# Patient Record
Sex: Female | Born: 1965 | Race: White | Hispanic: No | Marital: Married | Smoking: Never smoker
Health system: Southern US, Community
[De-identification: ages and names within clinical notes are randomized; demographics above are authoritative.]

## PROBLEM LIST (undated history)

## (undated) DIAGNOSIS — R112 Nausea with vomiting, unspecified: Secondary | ICD-10-CM

## (undated) DIAGNOSIS — Z9889 Other specified postprocedural states: Secondary | ICD-10-CM

## (undated) DIAGNOSIS — Z8489 Family history of other specified conditions: Secondary | ICD-10-CM

## (undated) HISTORY — PX: TONSILLECTOMY: SUR1361

---

## 1998-05-22 ENCOUNTER — Other Ambulatory Visit: Admission: RE | Admit: 1998-05-22 | Discharge: 1998-05-22 | Payer: Self-pay | Admitting: Obstetrics and Gynecology

## 1998-09-09 ENCOUNTER — Ambulatory Visit (HOSPITAL_COMMUNITY): Admission: RE | Admit: 1998-09-09 | Discharge: 1998-09-09 | Payer: Self-pay | Admitting: Obstetrics and Gynecology

## 1998-09-18 ENCOUNTER — Ambulatory Visit (HOSPITAL_COMMUNITY): Admission: RE | Admit: 1998-09-18 | Discharge: 1998-09-18 | Payer: Self-pay | Admitting: Obstetrics and Gynecology

## 1998-09-29 ENCOUNTER — Encounter: Payer: Self-pay | Admitting: Obstetrics and Gynecology

## 1998-09-29 ENCOUNTER — Ambulatory Visit (HOSPITAL_COMMUNITY): Admission: RE | Admit: 1998-09-29 | Discharge: 1998-09-29 | Payer: Self-pay | Admitting: Obstetrics and Gynecology

## 1998-11-19 ENCOUNTER — Inpatient Hospital Stay (HOSPITAL_COMMUNITY): Admission: AD | Admit: 1998-11-19 | Discharge: 1998-11-22 | Payer: Self-pay | Admitting: Obstetrics and Gynecology

## 1998-11-25 ENCOUNTER — Encounter (HOSPITAL_COMMUNITY): Admission: RE | Admit: 1998-11-25 | Discharge: 1999-02-23 | Payer: Self-pay | Admitting: Obstetrics and Gynecology

## 1998-11-25 ENCOUNTER — Inpatient Hospital Stay (HOSPITAL_COMMUNITY): Admission: AD | Admit: 1998-11-25 | Discharge: 1998-11-25 | Payer: Self-pay | Admitting: Obstetrics and Gynecology

## 2000-09-20 ENCOUNTER — Other Ambulatory Visit: Admission: RE | Admit: 2000-09-20 | Discharge: 2000-09-20 | Payer: Self-pay | Admitting: Obstetrics and Gynecology

## 2000-11-12 ENCOUNTER — Emergency Department (HOSPITAL_COMMUNITY): Admission: EM | Admit: 2000-11-12 | Discharge: 2000-11-12 | Payer: Self-pay | Admitting: Emergency Medicine

## 2002-06-04 ENCOUNTER — Encounter: Payer: Self-pay | Admitting: Obstetrics and Gynecology

## 2002-06-04 ENCOUNTER — Ambulatory Visit (HOSPITAL_COMMUNITY): Admission: RE | Admit: 2002-06-04 | Discharge: 2002-06-04 | Payer: Self-pay | Admitting: Obstetrics and Gynecology

## 2005-01-21 ENCOUNTER — Ambulatory Visit: Payer: Self-pay | Admitting: Internal Medicine

## 2005-04-12 ENCOUNTER — Ambulatory Visit: Payer: Self-pay | Admitting: Internal Medicine

## 2006-01-11 ENCOUNTER — Ambulatory Visit: Payer: Self-pay | Admitting: Internal Medicine

## 2006-08-11 ENCOUNTER — Encounter: Admission: RE | Admit: 2006-08-11 | Discharge: 2006-08-11 | Payer: Self-pay | Admitting: Obstetrics and Gynecology

## 2007-10-09 ENCOUNTER — Encounter: Admission: RE | Admit: 2007-10-09 | Discharge: 2007-10-09 | Payer: Self-pay | Admitting: Obstetrics and Gynecology

## 2007-10-16 ENCOUNTER — Encounter: Admission: RE | Admit: 2007-10-16 | Discharge: 2007-10-16 | Payer: Self-pay | Admitting: Obstetrics and Gynecology

## 2008-07-26 ENCOUNTER — Ambulatory Visit: Payer: Self-pay | Admitting: Internal Medicine

## 2008-07-26 DIAGNOSIS — K5909 Other constipation: Secondary | ICD-10-CM

## 2008-07-26 DIAGNOSIS — G5600 Carpal tunnel syndrome, unspecified upper limb: Secondary | ICD-10-CM

## 2008-11-11 ENCOUNTER — Encounter: Admission: RE | Admit: 2008-11-11 | Discharge: 2008-11-11 | Payer: Self-pay | Admitting: Obstetrics and Gynecology

## 2009-10-06 ENCOUNTER — Emergency Department (HOSPITAL_COMMUNITY): Admission: EM | Admit: 2009-10-06 | Discharge: 2009-10-06 | Payer: Self-pay | Admitting: Emergency Medicine

## 2009-11-13 ENCOUNTER — Encounter: Admission: RE | Admit: 2009-11-13 | Discharge: 2009-11-13 | Payer: Self-pay | Admitting: Obstetrics and Gynecology

## 2009-11-19 ENCOUNTER — Encounter: Admission: RE | Admit: 2009-11-19 | Discharge: 2009-11-19 | Payer: Self-pay | Admitting: Obstetrics and Gynecology

## 2009-11-24 ENCOUNTER — Encounter: Payer: Self-pay | Admitting: Internal Medicine

## 2010-11-29 ENCOUNTER — Encounter: Payer: Self-pay | Admitting: Obstetrics and Gynecology

## 2010-12-08 NOTE — Letter (Signed)
Summary: [facility]  [facility]   Imported By: Maryln Gottron 12/04/2009 09:38:23  _____________________________________________________________________  External Attachment:    Type:   Image     Comment:   External Document

## 2011-01-05 ENCOUNTER — Other Ambulatory Visit: Payer: Self-pay | Admitting: Obstetrics and Gynecology

## 2011-01-05 DIAGNOSIS — Z1231 Encounter for screening mammogram for malignant neoplasm of breast: Secondary | ICD-10-CM

## 2011-01-13 ENCOUNTER — Ambulatory Visit: Payer: Self-pay

## 2011-01-21 ENCOUNTER — Ambulatory Visit
Admission: RE | Admit: 2011-01-21 | Discharge: 2011-01-21 | Disposition: A | Discharge status: Home / Self Care | Payer: Commercial Indemnity | Source: Ambulatory Visit | Attending: Obstetrics and Gynecology | Admitting: Obstetrics and Gynecology

## 2011-01-21 DIAGNOSIS — Z1231 Encounter for screening mammogram for malignant neoplasm of breast: Secondary | ICD-10-CM

## 2011-02-10 LAB — POCT PREGNANCY, URINE: Preg Test, Ur: NEGATIVE

## 2011-02-10 LAB — URINALYSIS, ROUTINE W REFLEX MICROSCOPIC
Bilirubin Urine: NEGATIVE
Hgb urine dipstick: NEGATIVE
Nitrite: NEGATIVE
Specific Gravity, Urine: 1.026 (ref 1.005–1.030)
Urobilinogen, UA: 0.2 mg/dL (ref 0.0–1.0)
pH: 6 (ref 5.0–8.0)

## 2012-01-28 ENCOUNTER — Other Ambulatory Visit: Payer: Self-pay | Admitting: Obstetrics and Gynecology

## 2012-01-28 DIAGNOSIS — Z1231 Encounter for screening mammogram for malignant neoplasm of breast: Secondary | ICD-10-CM

## 2012-02-04 ENCOUNTER — Ambulatory Visit: Payer: Commercial Indemnity

## 2012-02-08 ENCOUNTER — Ambulatory Visit
Admission: RE | Admit: 2012-02-08 | Discharge: 2012-02-08 | Disposition: A | Discharge status: Home / Self Care | Payer: Commercial Indemnity | Source: Ambulatory Visit | Attending: Obstetrics and Gynecology | Admitting: Obstetrics and Gynecology

## 2012-02-08 DIAGNOSIS — Z1231 Encounter for screening mammogram for malignant neoplasm of breast: Secondary | ICD-10-CM

## 2012-09-26 ENCOUNTER — Other Ambulatory Visit: Payer: Self-pay | Admitting: Obstetrics and Gynecology

## 2012-09-26 MED ORDER — PRENATAL MULTIVITAMIN CH: 1.0000 | ORAL_TABLET | Freq: Every day | ORAL | Status: DC

## 2012-09-26 MED ORDER — HEPARIN SODIUM (PORCINE) 5000 UNIT/ML IJ SOLN: 5000.0000 [IU] | Freq: Three times a day (TID) | INTRAMUSCULAR | Status: DC

## 2012-11-29 ENCOUNTER — Encounter (HOSPITAL_COMMUNITY): Payer: Self-pay | Admitting: Pharmacist

## 2012-12-01 ENCOUNTER — Encounter (HOSPITAL_COMMUNITY)
Admission: RE | Admit: 2012-12-01 | Discharge: 2012-12-01 | Disposition: A | Discharge status: Home / Self Care | Payer: Managed Care, Other (non HMO) | Source: Ambulatory Visit | Attending: Obstetrics and Gynecology | Admitting: Obstetrics and Gynecology

## 2012-12-01 ENCOUNTER — Encounter (HOSPITAL_COMMUNITY): Payer: Self-pay

## 2012-12-01 HISTORY — DX: Family history of other specified conditions: Z84.89

## 2012-12-01 HISTORY — DX: Nausea with vomiting, unspecified: R11.2

## 2012-12-01 HISTORY — DX: Other specified postprocedural states: Z98.890

## 2012-12-01 LAB — URINALYSIS, ROUTINE W REFLEX MICROSCOPIC
Glucose, UA: NEGATIVE mg/dL
Ketones, ur: NEGATIVE mg/dL
Protein, ur: NEGATIVE mg/dL
Urobilinogen, UA: 0.2 mg/dL (ref 0.0–1.0)

## 2012-12-01 LAB — CBC
HCT: 40.1 % (ref 36.0–46.0)
MCH: 30.7 pg (ref 26.0–34.0)
MCHC: 33.4 g/dL (ref 30.0–36.0)
MCV: 91.8 fL (ref 78.0–100.0)
RDW: 12.5 % (ref 11.5–15.5)

## 2012-12-01 LAB — COMPREHENSIVE METABOLIC PANEL
AST: 16 U/L (ref 0–37)
Albumin: 4.3 g/dL (ref 3.5–5.2)
Alkaline Phosphatase: 64 U/L (ref 39–117)
BUN: 14 mg/dL (ref 6–23)
CO2: 26 mEq/L (ref 19–32)
Chloride: 97 mEq/L (ref 96–112)
GFR calc non Af Amer: >90 mL/min (ref >90)
Potassium: 4.2 mEq/L (ref 3.5–5.1)
Total Bilirubin: 0.2 mg/dL — ABNORMAL LOW (ref 0.3–1.2)

## 2012-12-01 LAB — URINE MICROSCOPIC-ADD ON

## 2012-12-01 NOTE — Patient Instructions (Addendum)
20 Holly Mckinney  12/01/2012   Your procedure is scheduled on:  12/13/12  Enter through the Main Entrance of [facility] at 0600 AM.  Pick up the phone at the desk and dial [phone].   Call this number if you have problems the morning of surgery: [phone]   Remember:   Do not eat food:After Midnight.  Do not drink clear liquids: After Midnight.  Take these medicines the morning of surgery with A SIP OF WATER:    Do not wear jewelry, make-up or nail polish.  Do not wear lotions, powders, or perfumes. You may wear deodorant.  Do not shave 48 hours prior to surgery.  Do not bring valuables to the hospital.  Contacts, dentures or bridgework may not be worn into surgery.  Leave suitcase in the car. After surgery it may be brought to your room.  For patients admitted to the hospital, checkout time is 11:00 AM the day of discharge.   Patients discharged the day of surgery will not be allowed to drive home.  Name and phone number of your driver: tim [phone]  Special Instructions: Shower using CHG 2 nights before surgery and the night before surgery.  If you shower the day of surgery use CHG.  Use special wash - you have one bottle of CHG for all showers.  You should use approximately 1/3 of the bottle for each shower.   Please read over the following fact sheets that you were given: MRSA Information

## 2012-12-12 NOTE — H&P (Signed)
Holly Mckinney, GUION NO.:  [id number]  MEDICAL RECORD NO.:  [id number]  LOCATION:                                 FACILITY:  PHYSICIAN:  Malachi Pro. Ambrose Mantle, M.D. DATE OF BIRTH:  1965-11-25  DATE OF ADMISSION: DATE OF DISCHARGE:                             HISTORY & PHYSICAL   PRESENT ILLNESS:  This is a 47 year old white female, para 2-0-0-2, who was admitted to the hospital for abdominal hysterectomy, possible bilateral salpingo-oophorectomy because of severe menorrhagia and dysmenorrhea, abnormal uterine bleeding, and some left lower abdominal pain.  Last menstrual period December 05, 2012, for 4 days, prior period November 11, 2012, for 14 days.  The patient has had menstrual problems for several years.  In December 2010, she reported more pain with menses, 1 episode in October 2010, had severe left lower abdominal pain. She underwent an endometrial biopsy that showed no evidence of malignancy.  An ultrasound showed no significant abnormalities.  She was not seen for some time, but in April 2013 complained of heavy periods using a pad and tampons every hour and a half, severe pain in the left lower abdominal area radiating into her left leg with her periods.  Pain was only with periods.  Periods were at 14-21 day intervals.  We did an endometrial biopsy that showed no evidence of malignancy.  Treated with Provera and she was treated with Provera for several cycles in a row with no significant benefit.  In November 2013, she wanted to have a hysterectomy.  Her period on October 28th lasted 16 days.  PAST MEDICAL HISTORY:  No latex allergy.  NOVOCAIN causes nausea and vomiting and elevated temperature.  She has no other drug allergies.  MEDICATIONS:  Valacyclovir for oral herpes.  SOCIAL HISTORY:  Drinks alcohol occasionally.  Never smoked cigarettes.  SURGICAL HISTORY:  She has had freezing of the cervix, tonsillectomy, C- section, and tubal ligation.   C-section was done x2.  FAMILY HISTORY:  Diabetes, heart disease, high cholesterol, hypertension, hyperthyroidism, and hypothyroidism.  OBSTETRIC HISTORY:  She has had 2 C-sections and tubal ligation.  PHYSICAL EXAMINATION:  GENERAL:  Well-developed, well-nourished white female, in no distress. VITAL SIGNS:  Blood pressure is 130/88, pulse is 72. HEAD, EYES, EARS, NOSE, AND THROAT:  Normal. NECK:  Supple without thyromegaly. BREASTS:  Soft without masses. LUNGS:  Clear to auscultation. HEART:  Normal size and sounds.  No murmurs. ABDOMEN:  Soft, nontender.  There is a healed lower transverse scar. GU:  Vulva and vagina are clean.  The cervix is clean.  Uterus is anterior, upper limit of normal size.  Adnexa are free of masses.  ADMITTING IMPRESSION:  Adenomyosis by ultrasound consistent with enlarged uterus, painful periods, and abnormal bleeding.  The patient is admitted for abdominal hysterectomy.  Whether to remove the tubes and ovaries will depend on the findings at surgery.  The patient wishes to keep the ovaries if the ovaries appear completely normal and there is no condition that suggest removing the ovaries will help her symptoms.  She has been informed of the risks of surgery including, but not limited to heart attack, stroke, pulmonary embolus,  wound disruption, hemorrhage with need for reoperation and/or transfusion, fistula formation, intestinal obstruction.  She understands and agrees to proceed with surgery.     Malachi Pro. Ambrose Mantle, M.D.     TFH/MEDQ  D:  12/12/2012  T:  12/12/2012  Job:  [id number]

## 2012-12-13 ENCOUNTER — Encounter (HOSPITAL_COMMUNITY): Payer: Self-pay

## 2012-12-13 ENCOUNTER — Inpatient Hospital Stay (HOSPITAL_COMMUNITY): Payer: Managed Care, Other (non HMO) | Admitting: Anesthesiology

## 2012-12-13 ENCOUNTER — Encounter (HOSPITAL_COMMUNITY): Payer: Self-pay | Admitting: *Deleted

## 2012-12-13 ENCOUNTER — Inpatient Hospital Stay (HOSPITAL_COMMUNITY)
Admission: RE | Admit: 2012-12-13 | Discharge: 2012-12-15 | DRG: 743 | Disposition: A | Discharge status: Home / Self Care | Payer: Managed Care, Other (non HMO) | Source: Ambulatory Visit | Attending: Obstetrics and Gynecology | Admitting: Obstetrics and Gynecology

## 2012-12-13 ENCOUNTER — Encounter (HOSPITAL_COMMUNITY): Payer: Self-pay | Admitting: Anesthesiology

## 2012-12-13 ENCOUNTER — Encounter (HOSPITAL_COMMUNITY)
Admission: RE | Disposition: A | Discharge status: Home / Self Care | Payer: Self-pay | Source: Ambulatory Visit | Attending: Obstetrics and Gynecology

## 2012-12-13 DIAGNOSIS — N920 Excessive and frequent menstruation with regular cycle: Principal | ICD-10-CM | POA: Diagnosis present

## 2012-12-13 DIAGNOSIS — N938 Other specified abnormal uterine and vaginal bleeding: Secondary | ICD-10-CM | POA: Diagnosis present

## 2012-12-13 DIAGNOSIS — N946 Dysmenorrhea, unspecified: Secondary | ICD-10-CM | POA: Diagnosis present

## 2012-12-13 DIAGNOSIS — N8000 Endometriosis of the uterus, unspecified: Secondary | ICD-10-CM | POA: Diagnosis present

## 2012-12-13 DIAGNOSIS — N852 Hypertrophy of uterus: Secondary | ICD-10-CM | POA: Diagnosis present

## 2012-12-13 DIAGNOSIS — R1032 Left lower quadrant pain: Secondary | ICD-10-CM | POA: Diagnosis present

## 2012-12-13 DIAGNOSIS — N949 Unspecified condition associated with female genital organs and menstrual cycle: Secondary | ICD-10-CM | POA: Diagnosis present

## 2012-12-13 HISTORY — PX: ABDOMINAL HYSTERECTOMY: SHX81

## 2012-12-13 LAB — PREGNANCY, URINE: Preg Test, Ur: NEGATIVE

## 2012-12-13 LAB — CBC
Hemoglobin: 12.5 g/dL (ref 12.0–15.0)
MCH: 31.2 pg (ref 26.0–34.0)
MCV: 93 fL (ref 78.0–100.0)
RBC: 4.01 MIL/uL (ref 3.87–5.11)

## 2012-12-13 LAB — CREATININE, SERUM: Creatinine, Ser: 0.67 mg/dL (ref 0.50–1.10)

## 2012-12-13 SURGERY — HYSTERECTOMY, ABDOMINAL
Anesthesia: General | Site: Abdomen | Wound class: Clean Contaminated

## 2012-12-13 MED ORDER — DIPHENHYDRAMINE HCL 12.5 MG/5ML PO ELIX: 12.5000 mg | ORAL_SOLUTION | Freq: Four times a day (QID) | ORAL | Status: DC | PRN

## 2012-12-13 MED ORDER — PROMETHAZINE HCL 25 MG/ML IJ SOLN
6.2500 mg | INTRAMUSCULAR | Status: AC | PRN
  Administered 2012-12-13 (×2): 6.25 mg via INTRAVENOUS

## 2012-12-13 MED ORDER — ONDANSETRON HCL 4 MG/2ML IJ SOLN
INTRAMUSCULAR | Status: DC | PRN
  Administered 2012-12-13: 4 mg via INTRAVENOUS

## 2012-12-13 MED ORDER — MEPERIDINE HCL 25 MG/ML IJ SOLN: 6.2500 mg | INTRAMUSCULAR | Status: DC | PRN

## 2012-12-13 MED ORDER — GLYCOPYRROLATE 0.2 MG/ML IJ SOLN
INTRAMUSCULAR | Status: DC | PRN
  Administered 2012-12-13: 0.1 mg via INTRAVENOUS
  Administered 2012-12-13: 0.6 mg via INTRAVENOUS

## 2012-12-13 MED ORDER — METOCLOPRAMIDE HCL 5 MG/ML IJ SOLN
INTRAMUSCULAR | Status: AC
  Administered 2012-12-13: 10 mg via INTRAVENOUS
  Filled 2012-12-13: qty 2

## 2012-12-13 MED ORDER — SCOPOLAMINE 1 MG/3DAYS TD PT72
1.0000 | MEDICATED_PATCH | TRANSDERMAL | Status: DC
  Administered 2012-12-13: 1.5 mg via TRANSDERMAL

## 2012-12-13 MED ORDER — ONDANSETRON HCL 4 MG PO TABS: 4.0000 mg | ORAL_TABLET | Freq: Four times a day (QID) | ORAL | Status: DC | PRN

## 2012-12-13 MED ORDER — HYDROMORPHONE HCL PF 1 MG/ML IJ SOLN
INTRAMUSCULAR | Status: DC | PRN
  Administered 2012-12-13 (×2): 0.5 mg via INTRAVENOUS
  Administered 2012-12-13: 1 mg via INTRAVENOUS

## 2012-12-13 MED ORDER — GLYCOPYRROLATE 0.2 MG/ML IJ SOLN
INTRAMUSCULAR | Status: AC
  Filled 2012-12-13: qty 1

## 2012-12-13 MED ORDER — LACTATED RINGERS IV SOLN
INTRAVENOUS | Status: DC
  Administered 2012-12-13 – 2012-12-14 (×2): via INTRAVENOUS

## 2012-12-13 MED ORDER — ROCURONIUM BROMIDE 50 MG/5ML IV SOLN
INTRAVENOUS | Status: AC
  Filled 2012-12-13: qty 1

## 2012-12-13 MED ORDER — OXYCODONE-ACETAMINOPHEN 5-325 MG PO TABS
1.0000 | ORAL_TABLET | Freq: Four times a day (QID) | ORAL | Status: DC | PRN
  Administered 2012-12-14 – 2012-12-15 (×4): 2 via ORAL
  Filled 2012-12-13 (×4): qty 2

## 2012-12-13 MED ORDER — HYDROMORPHONE 0.3 MG/ML IV SOLN
INTRAVENOUS | Status: DC
  Administered 2012-12-13: 3.3 mg via INTRAVENOUS
  Administered 2012-12-13: 11:00:00 via INTRAVENOUS
  Administered 2012-12-13: 4 mg via INTRAVENOUS
  Administered 2012-12-13: 2.99 mg via INTRAVENOUS
  Administered 2012-12-14: 0.799 mg via INTRAVENOUS
  Administered 2012-12-14: 1.39 mg via INTRAVENOUS
  Filled 2012-12-13: qty 25

## 2012-12-13 MED ORDER — DIPHENHYDRAMINE HCL 50 MG/ML IJ SOLN: 12.5000 mg | Freq: Four times a day (QID) | INTRAMUSCULAR | Status: DC | PRN

## 2012-12-13 MED ORDER — NEOSTIGMINE METHYLSULFATE 1 MG/ML IJ SOLN
INTRAMUSCULAR | Status: DC | PRN
  Administered 2012-12-13: 3 mg via INTRAVENOUS

## 2012-12-13 MED ORDER — LIDOCAINE HCL (CARDIAC) 20 MG/ML IV SOLN
INTRAVENOUS | Status: AC
  Filled 2012-12-13: qty 5

## 2012-12-13 MED ORDER — GLYCOPYRROLATE 0.2 MG/ML IJ SOLN
INTRAMUSCULAR | Status: AC
  Filled 2012-12-13: qty 2

## 2012-12-13 MED ORDER — HEPARIN SODIUM (PORCINE) 5000 UNIT/ML IJ SOLN
5000.0000 [IU] | INTRAMUSCULAR | Status: AC
  Administered 2012-12-13: 5000 [IU] via SUBCUTANEOUS

## 2012-12-13 MED ORDER — KETOROLAC TROMETHAMINE 30 MG/ML IJ SOLN: 15.0000 mg | Freq: Once | INTRAMUSCULAR | Status: DC | PRN

## 2012-12-13 MED ORDER — HEPARIN SODIUM (PORCINE) 5000 UNIT/ML IJ SOLN
INTRAMUSCULAR | Status: AC
  Administered 2012-12-13: 5000 [IU] via SUBCUTANEOUS
  Filled 2012-12-13: qty 1

## 2012-12-13 MED ORDER — MIDAZOLAM HCL 2 MG/2ML IJ SOLN: 0.5000 mg | Freq: Once | INTRAMUSCULAR | Status: DC | PRN

## 2012-12-13 MED ORDER — FENTANYL CITRATE 0.05 MG/ML IJ SOLN
INTRAMUSCULAR | Status: DC | PRN
  Administered 2012-12-13: 50 ug via INTRAVENOUS
  Administered 2012-12-13: 100 ug via INTRAVENOUS
  Administered 2012-12-13 (×2): 50 ug via INTRAVENOUS

## 2012-12-13 MED ORDER — DEXAMETHASONE SODIUM PHOSPHATE 10 MG/ML IJ SOLN
INTRAMUSCULAR | Status: DC | PRN
  Administered 2012-12-13: 10 mg via INTRAVENOUS

## 2012-12-13 MED ORDER — HEPARIN SODIUM (PORCINE) 5000 UNIT/ML IJ SOLN
5000.0000 [IU] | Freq: Three times a day (TID) | INTRAMUSCULAR | Status: DC
  Administered 2012-12-13 – 2012-12-15 (×6): 5000 [IU] via SUBCUTANEOUS
  Filled 2012-12-13 (×7): qty 1

## 2012-12-13 MED ORDER — CEFAZOLIN SODIUM 1-5 GM-% IV SOLN
1.0000 g | Freq: Three times a day (TID) | INTRAVENOUS | Status: AC
  Administered 2012-12-13: 1 g via INTRAVENOUS
  Filled 2012-12-13 (×2): qty 50

## 2012-12-13 MED ORDER — ONDANSETRON HCL 4 MG/2ML IJ SOLN
INTRAMUSCULAR | Status: AC
  Filled 2012-12-13: qty 2

## 2012-12-13 MED ORDER — PROMETHAZINE HCL 25 MG/ML IJ SOLN
INTRAMUSCULAR | Status: AC
  Administered 2012-12-13: 6.25 mg via INTRAVENOUS
  Filled 2012-12-13: qty 1

## 2012-12-13 MED ORDER — CEFAZOLIN SODIUM-DEXTROSE 2-3 GM-% IV SOLR
2.0000 g | INTRAVENOUS | Status: AC
  Administered 2012-12-13: 2 g via INTRAVENOUS

## 2012-12-13 MED ORDER — HYDROMORPHONE HCL PF 1 MG/ML IJ SOLN
INTRAMUSCULAR | Status: AC
  Filled 2012-12-13: qty 1

## 2012-12-13 MED ORDER — FENTANYL CITRATE 0.05 MG/ML IJ SOLN
INTRAMUSCULAR | Status: AC
  Filled 2012-12-13: qty 5

## 2012-12-13 MED ORDER — ONDANSETRON HCL 4 MG/2ML IJ SOLN: 4.0000 mg | Freq: Four times a day (QID) | INTRAMUSCULAR | Status: DC | PRN

## 2012-12-13 MED ORDER — FENTANYL CITRATE 0.05 MG/ML IJ SOLN
INTRAMUSCULAR | Status: AC
  Administered 2012-12-13: 50 ug via INTRAVENOUS
  Filled 2012-12-13: qty 2

## 2012-12-13 MED ORDER — PROPOFOL 10 MG/ML IV BOLUS
INTRAVENOUS | Status: DC | PRN
  Administered 2012-12-13: 160 mg via INTRAVENOUS
  Administered 2012-12-13: 40 mg via INTRAVENOUS

## 2012-12-13 MED ORDER — LIDOCAINE HCL (CARDIAC) 20 MG/ML IV SOLN
INTRAVENOUS | Status: DC | PRN
  Administered 2012-12-13: 70 mg via INTRAVENOUS

## 2012-12-13 MED ORDER — SCOPOLAMINE 1 MG/3DAYS TD PT72
MEDICATED_PATCH | TRANSDERMAL | Status: AC
  Administered 2012-12-13: 1.5 mg via TRANSDERMAL
  Filled 2012-12-13: qty 1

## 2012-12-13 MED ORDER — SODIUM CHLORIDE 0.9 % IJ SOLN: 9.0000 mL | INTRAMUSCULAR | Status: DC | PRN

## 2012-12-13 MED ORDER — ROCURONIUM BROMIDE 100 MG/10ML IV SOLN
INTRAVENOUS | Status: DC | PRN
  Administered 2012-12-13 (×2): 5 mg via INTRAVENOUS
  Administered 2012-12-13: 10 mg via INTRAVENOUS
  Administered 2012-12-13 (×2): 5 mg via INTRAVENOUS
  Administered 2012-12-13: 35 mg via INTRAVENOUS
  Administered 2012-12-13: 10 mg via INTRAVENOUS

## 2012-12-13 MED ORDER — 0.9 % SODIUM CHLORIDE (POUR BTL) OPTIME
TOPICAL | Status: DC | PRN
  Administered 2012-12-13: 1000 mL

## 2012-12-13 MED ORDER — MIDAZOLAM HCL 5 MG/5ML IJ SOLN
INTRAMUSCULAR | Status: DC | PRN
  Administered 2012-12-13: 2 mg via INTRAVENOUS

## 2012-12-13 MED ORDER — NALOXONE HCL 0.4 MG/ML IJ SOLN: 0.4000 mg | INTRAMUSCULAR | Status: DC | PRN

## 2012-12-13 MED ORDER — FENTANYL CITRATE 0.05 MG/ML IJ SOLN
25.0000 ug | INTRAMUSCULAR | Status: DC | PRN
  Administered 2012-12-13: 50 ug via INTRAVENOUS
  Administered 2012-12-13: 25 ug via INTRAVENOUS

## 2012-12-13 MED ORDER — DEXAMETHASONE SODIUM PHOSPHATE 10 MG/ML IJ SOLN
INTRAMUSCULAR | Status: AC
  Filled 2012-12-13: qty 1

## 2012-12-13 MED ORDER — PROPOFOL 10 MG/ML IV EMUL
INTRAVENOUS | Status: AC
  Filled 2012-12-13: qty 20

## 2012-12-13 MED ORDER — LACTATED RINGERS IV SOLN
INTRAVENOUS | Status: DC
  Administered 2012-12-13 (×3): via INTRAVENOUS

## 2012-12-13 MED ORDER — METOCLOPRAMIDE HCL 5 MG/ML IJ SOLN
10.0000 mg | Freq: Once | INTRAMUSCULAR | Status: AC | PRN
  Administered 2012-12-13: 10 mg via INTRAVENOUS

## 2012-12-13 MED ORDER — DIPHENHYDRAMINE HCL 50 MG/ML IJ SOLN
INTRAMUSCULAR | Status: AC
  Filled 2012-12-13: qty 1

## 2012-12-13 MED ORDER — MIDAZOLAM HCL 2 MG/2ML IJ SOLN
INTRAMUSCULAR | Status: AC
  Filled 2012-12-13: qty 2

## 2012-12-13 MED ORDER — NEOSTIGMINE METHYLSULFATE 1 MG/ML IJ SOLN
INTRAMUSCULAR | Status: AC
  Filled 2012-12-13: qty 1

## 2012-12-13 SURGICAL SUPPLY — 37 items
CANISTER SUCTION 2500CC (MISCELLANEOUS) ×2 IMPLANT
CLOTH BEACON ORANGE TIMEOUT ST (SAFETY) ×2 IMPLANT
CONT PATH 16OZ SNAP LID 3702 (MISCELLANEOUS) ×2 IMPLANT
DECANTER SPIKE VIAL GLASS SM (MISCELLANEOUS) IMPLANT
DRSG COVADERM 4X10 (GAUZE/BANDAGES/DRESSINGS) ×1 IMPLANT
DRSG OPSITE POSTOP 4X10 (GAUZE/BANDAGES/DRESSINGS) ×1 IMPLANT
DRSG VASELINE 3X18 (GAUZE/BANDAGES/DRESSINGS) ×2 IMPLANT
GAUZE SPONGE 4X4 12PLY STRL LF (GAUZE/BANDAGES/DRESSINGS) ×2 IMPLANT
GAUZE VASELINE 3X9 (GAUZE/BANDAGES/DRESSINGS) ×1 IMPLANT
GLOVE BIO SURGEON STRL SZ7.5 (GLOVE) ×4 IMPLANT
GLOVE BIOGEL PI IND STRL 7.0 (GLOVE) IMPLANT
GLOVE BIOGEL PI INDICATOR 7.0 (GLOVE) ×1
GLOVE INDICATOR 7.0 STRL GRN (GLOVE) ×1 IMPLANT
GLOVE ORTHO TXT STRL SZ7.5 (GLOVE) ×1 IMPLANT
GOWN PREVENTION PLUS LG XLONG (DISPOSABLE) ×4 IMPLANT
GOWN PREVENTION PLUS XLARGE (GOWN DISPOSABLE) ×2 IMPLANT
NS IRRIG 1000ML POUR BTL (IV SOLUTION) ×2 IMPLANT
PACK ABDOMINAL GYN (CUSTOM PROCEDURE TRAY) ×2 IMPLANT
PAD OB MATERNITY 4.3X12.25 (PERSONAL CARE ITEMS) ×2 IMPLANT
PROTECTOR NERVE ULNAR (MISCELLANEOUS) ×2 IMPLANT
RETRACTOR WND ALEXIS 25 LRG (MISCELLANEOUS) IMPLANT
RTRCTR WOUND ALEXIS 25CM LRG (MISCELLANEOUS)
SPONGE LAP 18X18 X RAY DECT (DISPOSABLE) ×4 IMPLANT
SPONGE LAP 4X18 X RAY DECT (DISPOSABLE) ×2 IMPLANT
STAPLER VISISTAT 35W (STAPLE) ×2 IMPLANT
SUT PDS AB 0 CTX 60 (SUTURE) IMPLANT
SUT VIC AB 0 CT1 18XCR BRD8 (SUTURE) ×3 IMPLANT
SUT VIC AB 0 CT1 27 (SUTURE) ×12
SUT VIC AB 0 CT1 27XBRD ANBCTR (SUTURE) ×3 IMPLANT
SUT VIC AB 0 CT1 8-18 (SUTURE) ×6
SUT VIC AB 3-0 CTX 36 (SUTURE) ×2 IMPLANT
SUT VIC AB 3-0 SH 27 (SUTURE)
SUT VIC AB 3-0 SH 27X BRD (SUTURE) IMPLANT
SUT VICRYL 0 TIES 12 18 (SUTURE) ×2 IMPLANT
TOWEL OR 17X24 6PK STRL BLUE (TOWEL DISPOSABLE) ×4 IMPLANT
TRAY FOLEY CATH 14FR (SET/KITS/TRAYS/PACK) ×2 IMPLANT
WATER STERILE IRR 1000ML POUR (IV SOLUTION) ×2 IMPLANT

## 2012-12-13 NOTE — Progress Notes (Signed)
Day of Surgery Procedure(s) (LRB): HYSTERECTOMY ABDOMINAL (N/A)  Subjective: Patient reports tolerating PO clears with no nausea.  Pain controlled with PCA.  Objective: I have reviewed patient's vital signs and intake and output.  GI: soft NT and incision C/D/I  Assessment: s/p Procedure(s) (LRB) with comments: HYSTERECTOMY ABDOMINAL (N/A) - with Bilateral Salpingectomy: stable  Plan: Pt ambulated x 1  Will try some broth this PM. Continue care.  LOS: 0 days    Holly Mckinney W 12/13/2012, 6:34 PM

## 2012-12-13 NOTE — Anesthesia Postprocedure Evaluation (Signed)
  Anesthesia Post-op Note  Patient: Holly Mckinney  Procedure(s) Performed: Procedure(s) (LRB) with comments: HYSTERECTOMY ABDOMINAL (N/A) - with Bilateral Salpingectomy  Patient Location: PACU and Women's Unit  Anesthesia Type:General  Level of Consciousness: awake, alert  and oriented  Airway and Oxygen Therapy: Patient Spontanous Breathing and Patient connected to nasal cannula oxygen  Post-op Pain: mild  Post-op Assessment: Post-op Vital signs reviewed and Patient's Cardiovascular Status Stable  Post-op Vital Signs: Reviewed and stable  Complications: No apparent anesthesia complications

## 2012-12-13 NOTE — Addendum Note (Signed)
Addendum  created 12/13/12 1954 by Shanon Payor, CRNA   Modules edited:Notes Section

## 2012-12-13 NOTE — Anesthesia Procedure Notes (Signed)
Procedure Name: Intubation Date/Time: 12/13/2012 7:31 AM Performed by: Holly Mckinney Pre-anesthesia Checklist: Patient identified, Emergency Drugs available, Suction available, Patient being monitored and Timeout performed Patient Re-evaluated:Patient Re-evaluated prior to inductionOxygen Delivery Method: Circle system utilized Preoxygenation: Pre-oxygenation with 100% oxygen Intubation Type: IV induction Ventilation: Mask ventilation without difficulty Laryngoscope Size: Mac and 3 Grade View: Grade I Tube type: Oral Tube size: 7.0 mm Number of attempts: 1 Airway Equipment and Method: Stylet Placement Confirmation: ETT inserted through vocal cords under direct vision,  positive ETCO2,  CO2 detector and breath sounds checked- equal and bilateral Secured at: 20 cm Tube secured with: Tape Dental Injury: Teeth and Oropharynx as per pre-operative assessment

## 2012-12-13 NOTE — Transfer of Care (Signed)
Immediate Anesthesia Transfer of Care Note  Patient: Holly Mckinney  Procedure(s) Performed: Procedure(s) (LRB) with comments: HYSTERECTOMY ABDOMINAL (N/A) - with Bilateral Salpingectomy  Patient Location: PACU  Anesthesia Type:General  Level of Consciousness: awake, alert  and oriented  Airway & Oxygen Therapy: Patient Spontanous Breathing and Patient connected to nasal cannula oxygen  Post-op Assessment: Report given to PACU RN and Post -op Vital signs reviewed and stable  Post vital signs: Reviewed and stable  Complications: No apparent anesthesia complications

## 2012-12-13 NOTE — Progress Notes (Signed)
Patient ID: Holly Mckinney, female   DOB: 1965-11-20, 47 y.o.   MRN: [id number] I examined this lady 12-11-12 and she reports no change in her health since that time

## 2012-12-13 NOTE — Anesthesia Postprocedure Evaluation (Signed)
  Anesthesia Post-op Note  Patient: Holly Mckinney  Procedure(s) Performed: Procedure(s) (LRB) with comments: HYSTERECTOMY ABDOMINAL (N/A) - with Bilateral Salpingectomy  Patient is awake and responsive. Pain and nausea are reasonably well controlled. Vital signs are stable and clinically acceptable. Oxygen saturation is clinically acceptable. There are no apparent anesthetic complications at this time. Patient is ready for discharge.

## 2012-12-13 NOTE — Op Note (Addendum)
Operative note on Holly Mckinney:  Date of the operation:12-13-12  Preoperative diagnosis: Menorrhagia, Dysmenorrhea, Abnormal uterine bleeding,presumed adenomyosis   Postoperative diagnosis: Same with dense adhesions of the lower uterine segment to the anterior abdominal wall of the omentum to the right side of the fundus and to both tubes  Operation: Abdominal hysterectomy, bilateral salpingectomy, division of adhesions  Operator: Ambrose Mantle assistant: Meisinger  Anesthesia: Gen.  The patient was brought to the operating room and placed under satisfactory general anesthesia and placed in frog-leg position. The abdomen was prepped with Betadine solution. The vagina was prepped with Betadine, the urethra was prepped and a Foley catheter was inserted to straight drain. Exam revealed no additional findings from the preop exam. A timeout was done. The abdomen was draped as a sterile field an incision was made through the old scar and carried in layers through the skin subcutaneous tissue and fascia. The fascia was incised transversely separated from the rectus muscles superiorly and inferiorly, and the peritoneum was opened. There were some adhesions of the omentum to the abdominal wall to the right anterior fundus and to both tubes. These were all divided with the Bovie. The upper abdomen was explored I could not reach the gallbladder. The liver edge felt smooth and the inferior portions of the kidneys felt normal. The anterior cul-de-sac was completely absent. The lower part of the uterus was adherent to the bladder obliterating the anterior cul-de-sac. A Balfour retractor and a bladder blade were used. 3 packs were used to pack the bowel away. I worked on the anterior cul-de-sac trying to free the bladder from the lower uterine segment with a combination of electrical dissection and sharp dissection. The upper pedicles were clamped across with Kelly clamps and the upper pedicles were divided between clamps and  doubly suture ligated. The left round ligament was included in the suture with the tube and ovary on the right side the round ligament was freed and it was divided separately. As I worked down the uterus I found a good plane between the bladder and the anterior uterus and pushed the bladder well away. Multiple bites were taken along the parametrial and paracervical tissues because the patient had a long cervix. I reached the uterosacral ligaments then clamped cut and suture-ligated and held them. The vaginal angles were entered and vaginal angle sutures were placed. The uterus was then removed by transecting the upper vagina. The cervix was longer than the fundus of the uterus. The central portion of the vagina was closed with interrupted figure-of-eight sutures of 0 Vicryl. Liberal irrigation confirmed hemostasis except for couple spots on the anterior vagina that were bovied. I then turned my attention to the distal portion of both  tubes because I removed the proximal portion of the  tubes with the uterine specimen.On the left the tube. was removed with electrical dissection and on the right with one clamp and it was suture ligated. Liberal irrigation confirmed hemostasis. I  reperitonealized the vaginal cuff with a figure-of-eight suture of 0 Vicryl. I sutured the uterosacral ligaments together in the midline. The posterior cul-de-sac was normal. There is no evidence of endometriosis in the pelvis. The packs and retractors were removed. The abdominal wall was closed in layers using interrupted sutures of 0 Vicryl to close the rectus muscle and peritoneum in one layer. The fascia was closed with 2 running sutures of 0 Vicryl, the subcutaneous tissue with a running 3-0 Vicryl, and the skin was closed with staples. The patient seemed to  tolerate the procedure well blood loss about 250 cc. Sponge and needle counts were correct. The patient was returned to recovery in satisfactory condition.Both ureters were  pal;pated prior to the closure and were normal

## 2012-12-13 NOTE — Anesthesia Preprocedure Evaluation (Signed)
Anesthesia Evaluation  Patient identified by MRN, date of birth, ID band Patient awake    Reviewed: Allergy & Precautions, H&P , Patient's Chart, lab work & pertinent test results, reviewed documented beta blocker date and time   History of Anesthesia Complications (+) PONV and Family history of anesthesia reaction  Airway Mallampati: II TM Distance: >3 FB Neck ROM: full    Dental No notable dental hx.    Pulmonary neg pulmonary ROS,  breath sounds clear to auscultation  Pulmonary exam normal       Cardiovascular Exercise Tolerance: Good negative cardio ROS  Rhythm:regular Rate:Normal     Neuro/Psych negative neurological ROS  negative psych ROS   GI/Hepatic negative GI ROS, Neg liver ROS,   Endo/Other  negative endocrine ROS  Renal/GU negative Renal ROS     Musculoskeletal   Abdominal   Peds  Hematology negative hematology ROS (+)   Anesthesia Other Findings   Reproductive/Obstetrics negative OB ROS                           Anesthesia Physical Anesthesia Plan  ASA: II  Anesthesia Plan: General ETT   Post-op Pain Management:    Induction:   Airway Management Planned:   Additional Equipment:   Intra-op Plan:   Post-operative Plan:   Informed Consent: I have reviewed the patients History and Physical, chart, labs and discussed the procedure including the risks, benefits and alternatives for the proposed anesthesia with the patient or authorized representative who has indicated his/her understanding and acceptance.   Dental Advisory Given  Plan Discussed with: CRNA and Surgeon  Anesthesia Plan Comments:         Anesthesia Quick Evaluation

## 2012-12-14 ENCOUNTER — Encounter (HOSPITAL_COMMUNITY): Payer: Self-pay | Admitting: Obstetrics and Gynecology

## 2012-12-14 LAB — CBC WITH DIFFERENTIAL/PLATELET
Eosinophils Relative: 0 % (ref 0–5)
Lymphocytes Relative: 15 % (ref 12–46)
Lymphs Abs: 1.5 10*3/uL (ref 0.7–4.0)
MCV: 92.9 fL (ref 78.0–100.0)
Neutro Abs: 7.5 10*3/uL (ref 1.7–7.7)
Platelets: 234 10*3/uL (ref 150–400)
RBC: 3.68 MIL/uL — ABNORMAL LOW (ref 3.87–5.11)
WBC: 10.1 10*3/uL (ref 4.0–10.5)

## 2012-12-14 NOTE — Progress Notes (Signed)
Patient ID: Holly Mckinney, female   DOB: [birth year], 47 y.o.   MRN: [id number] #1 afebrile BP OK output good HGB stable Not ready for d/c.

## 2012-12-15 MED ORDER — IBUPROFEN 600 MG PO TABS: 600.0000 mg | ORAL_TABLET | Freq: Four times a day (QID) | ORAL | Status: DC | PRN

## 2012-12-15 MED ORDER — OXYCODONE-ACETAMINOPHEN 5-325 MG PO TABS: 1.0000 | ORAL_TABLET | Freq: Four times a day (QID) | ORAL | Status: DC | PRN

## 2012-12-15 NOTE — Progress Notes (Signed)
Patient ID: Holly Mckinney, female   DOB: 04-10-1966, 47 y.o.   MRN: [id number] #2 afebrile BP normal tolerating a diet, ambulating voiding well. Ready for d/c Path report pending

## 2012-12-15 NOTE — Discharge Summary (Signed)
Holly Mckinney, Holly Mckinney NO.:  [id number]  MEDICAL RECORD NO.:  [id number]  LOCATION:  9304                          FACILITY:  [facility]  PHYSICIAN:  Malachi Pro. Ambrose Mantle, M.D. DATE OF BIRTH:  1966/08/27  DATE OF ADMISSION:  12/13/2012 DATE OF DISCHARGE:  12/15/2012                              DISCHARGE SUMMARY   HISTORY OF PRESENT ILLNESS:  A 47 year old white female, para 2-0-2, who was admitted for abdominal hysterectomy because of severe menorrhagia and dysmenorrhea, abnormal uterine bleeding, left lower abdominal pain and ultrasound suggestive of adenomyosis.  The patient had failed multiple rounds of progesterone therapy.  She was admitted and underwent abdominal hysterectomy.  The proximal portion of the tube was removed with the uterus and the distal portion removed separately.  The ovaries were left in situ.  Path report is pending.  Postoperatively, the patient did well.  She tolerated a diet, voided well, ambulated well without difficulty, and is ready for discharge on the second postop day.  FINAL DIAGNOSES:  Menorrhagia, dysmenorrhea, probable adenomyosis, multiple pelvic adhesions discovered at surgery.  OPERATION:  Abdominal hysterectomy.  Bilateral salpingectomy.  Division of adhesions.  FINAL CONDITION:  Improved.  INSTRUCTIONS:  Our regular discharge instructions.  No vaginal entrance. No heavy lifting or strenuous activity.  Call with any heavy vaginal bleeding.  Call with a temperature elevation greater than 100.4 degrees. Return to the office in 4 or 5 days to have her staples removed.  DISCHARGE MEDICATIONS:  Percocet 5/325, 30 tablets, 1 every 6 hours as needed for pain and Motrin 600 mg 30 tablets, 1 every 6 hours as needed for pain.  As stated earlier the path report is pending.     Malachi Pro. Ambrose Mantle, M.D.     TFH/MEDQ  D:  12/15/2012  T:  12/15/2012  Job:  [id number]

## 2012-12-15 NOTE — Progress Notes (Signed)
Discharge instructions reviewed with patient and spouse.  Discussed activity, medications, signs/symptoms to report to MD or seek help for, community resources and return MD office visit.  No home equipment needed.  Ambulated to car with staff without incident and discharged to home with spouse.

## 2013-01-16 ENCOUNTER — Other Ambulatory Visit: Payer: Self-pay | Admitting: Obstetrics and Gynecology

## 2013-01-16 ENCOUNTER — Other Ambulatory Visit: Payer: Self-pay

## 2013-02-09 ENCOUNTER — Ambulatory Visit
Admission: RE | Admit: 2013-02-09 | Discharge: 2013-02-09 | Disposition: A | Discharge status: Home / Self Care | Payer: Managed Care, Other (non HMO) | Source: Ambulatory Visit

## 2013-02-09 DIAGNOSIS — Z1231 Encounter for screening mammogram for malignant neoplasm of breast: Secondary | ICD-10-CM

## 2013-02-12 ENCOUNTER — Other Ambulatory Visit: Payer: Self-pay | Admitting: Obstetrics and Gynecology

## 2013-02-12 DIAGNOSIS — R928 Other abnormal and inconclusive findings on diagnostic imaging of breast: Secondary | ICD-10-CM

## 2013-02-23 ENCOUNTER — Ambulatory Visit
Admission: RE | Admit: 2013-02-23 | Discharge: 2013-02-23 | Disposition: A | Discharge status: Home / Self Care | Payer: Managed Care, Other (non HMO) | Source: Ambulatory Visit | Attending: Obstetrics and Gynecology | Admitting: Obstetrics and Gynecology

## 2013-02-23 DIAGNOSIS — R928 Other abnormal and inconclusive findings on diagnostic imaging of breast: Secondary | ICD-10-CM

## 2014-01-01 ENCOUNTER — Ambulatory Visit (INDEPENDENT_AMBULATORY_CARE_PROVIDER_SITE_OTHER): Payer: Managed Care, Other (non HMO) | Admitting: Podiatry

## 2014-01-01 ENCOUNTER — Ambulatory Visit: Payer: Self-pay | Admitting: Podiatry

## 2014-01-01 ENCOUNTER — Encounter: Payer: Self-pay | Admitting: Podiatry

## 2014-01-01 ENCOUNTER — Other Ambulatory Visit: Payer: Self-pay | Admitting: *Deleted

## 2014-01-01 ENCOUNTER — Ambulatory Visit (INDEPENDENT_AMBULATORY_CARE_PROVIDER_SITE_OTHER): Payer: Managed Care, Other (non HMO)

## 2014-01-01 VITALS — BP 133/52 | HR 89 | Resp 16 | Ht 64.0 in | Wt 175.0 lb

## 2014-01-01 DIAGNOSIS — M79609 Pain in unspecified limb: Secondary | ICD-10-CM

## 2014-01-01 DIAGNOSIS — M779 Enthesopathy, unspecified: Secondary | ICD-10-CM

## 2014-01-01 DIAGNOSIS — M79673 Pain in unspecified foot: Secondary | ICD-10-CM

## 2014-01-01 DIAGNOSIS — M7750 Other enthesopathy of unspecified foot: Secondary | ICD-10-CM

## 2014-01-01 DIAGNOSIS — M79606 Pain in leg, unspecified: Secondary | ICD-10-CM

## 2014-01-01 DIAGNOSIS — T148XXA Other injury of unspecified body region, initial encounter: Secondary | ICD-10-CM

## 2014-01-01 DIAGNOSIS — S92109A Unspecified fracture of unspecified talus, initial encounter for closed fracture: Secondary | ICD-10-CM

## 2014-01-01 DIAGNOSIS — M778 Other enthesopathies, not elsewhere classified: Secondary | ICD-10-CM

## 2014-01-01 MED ORDER — MELOXICAM 15 MG PO TABS: 15.0000 mg | ORAL_TABLET | Freq: Every day | ORAL | Status: DC

## 2014-01-01 MED ORDER — METHYLPREDNISOLONE (PAK) 4 MG PO TABS: ORAL_TABLET | ORAL | Status: DC

## 2014-01-01 NOTE — Progress Notes (Signed)
   Subjective:    Patient ID: Holly Mckinney, female    DOB: May 06, 1966, 48 y.o.   MRN: [id number]  HPI Comments: "I have this pain in my foot"  Patient C/O of aching pain anterior and medial ankle for 2 months. No injury. Does have AM pain and the area swells. Painful ROM with ankle movement. Worse at the end of the day after standing all day. Notices that she is having a sharp, sudden pain occasionally through the top of foot. She has only tried Advil.  Foot Pain      Review of Systems  All other systems reviewed and are negative.       Objective:   Physical Exam: I have reviewed her past mental history medications allergies surgeries and social history. At this point vital signs are stable she is alert and oriented x3. She relates possible injury as long as me years ago an initial onset of pain and proximally 6 months ago however for the past 2 months it has worsened and is limiting her daily activities. 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 She states that her foot this doesn't seem to be functioning correctly. Pulses are strongly palpable bilateral neurologic sensorium is intact per Semmes-Weinstein monofilament. Deep tendon reflexes are intact bilateral muscle strength +5 over 5 dorsiflexors plantar flexors inverters everters all intrinsic musculature is intact. Orthopedic evaluation demonstrates large nonpulsatile mass to the anterior medial aspect of the superior ankle extending to the level of the insertion of the tibialis anterior tendon. She has pain on palpation of this nonpulsatile mass which appears to be quite fluctuant. She has severe pain on dorsiflexion and inversion against resistance. More than likely a tear or deficit of the tibialis anterior tendon. She also has pain on a hard osseous nonpulsatile mass just lateral to the tendon. This appears to be the head of the talar this. This is area is exquisitely tender. Radiographic evaluation does demonstrate the  swelling of the anterior ankle and areas superior to it as well as a osseous mass with a possible fracture as well as osteoarthritic changes to the talonavicular joint of the right foot. Cutaneous evaluation demonstrates supple well hydrated cutis no erythema edema cellulitis drainage or odor.        Assessment & Plan:  Assessment: Tibialis anterior tendon tear possible mass of the anterior ankle. Pain with a large osseous mass to the head and neck of the talar this as well as osteoarthritic changes of the talonavicular articulation.  Plan: We discussed the etiology pathology conservative versus surgical therapies. I am concerned of this osseously mass in the changes at that joint. We placed her in immobilization. Elevation and ice. Started her on a Sterapred Dosepak to be followed by Mobic. She will have an MRI with contrast secondary to the masses the soft tissue and osseous mass. I will notify her immediately upon her results.

## 2014-01-07 ENCOUNTER — Telehealth: Payer: Self-pay | Admitting: *Deleted

## 2014-01-07 NOTE — Telephone Encounter (Addendum)
[facility] states pt's MRI for Ankle was PRE-CERTIFIED, the foot was denied.  GI asked if they should cancel the foot and continue with the ankle.  Dr Stephenie Acres response faxed to [place] [phone].

## 2014-01-07 NOTE — Telephone Encounter (Signed)
The ankle view will be fine but ask GI if they can follow the tibialis anterior to its insertion point on the dorsomedial foot.    Thanks

## 2014-01-09 ENCOUNTER — Other Ambulatory Visit: Payer: Managed Care, Other (non HMO)

## 2014-01-09 ENCOUNTER — Ambulatory Visit
Admission: RE | Admit: 2014-01-09 | Discharge: 2014-01-09 | Disposition: A | Discharge status: Home / Self Care | Payer: Managed Care, Other (non HMO) | Source: Ambulatory Visit | Attending: Podiatry | Admitting: Podiatry

## 2014-01-09 DIAGNOSIS — M79606 Pain in leg, unspecified: Secondary | ICD-10-CM

## 2014-01-09 MED ORDER — GADOBENATE DIMEGLUMINE 529 MG/ML IV SOLN
15.0000 mL | Freq: Once | INTRAVENOUS | Status: AC | PRN
  Administered 2014-01-09: 15 mL via INTRAVENOUS

## 2014-01-16 NOTE — Progress Notes (Signed)
SCHEDULED FOR 12/25/13

## 2014-01-16 NOTE — Progress Notes (Signed)
SCHEDULED PT ON 12/25/13 @ 2:15 PM

## 2014-01-22 ENCOUNTER — Ambulatory Visit (INDEPENDENT_AMBULATORY_CARE_PROVIDER_SITE_OTHER): Payer: Managed Care, Other (non HMO) | Admitting: Podiatry

## 2014-01-22 ENCOUNTER — Encounter: Payer: Self-pay | Admitting: Podiatry

## 2014-01-22 VITALS — BP 133/62 | HR 89 | Resp 16

## 2014-01-22 DIAGNOSIS — M19079 Primary osteoarthritis, unspecified ankle and foot: Secondary | ICD-10-CM

## 2014-01-22 DIAGNOSIS — M779 Enthesopathy, unspecified: Principal | ICD-10-CM

## 2014-01-22 DIAGNOSIS — M7750 Other enthesopathy of unspecified foot: Secondary | ICD-10-CM

## 2014-01-22 DIAGNOSIS — M778 Other enthesopathies, not elsewhere classified: Secondary | ICD-10-CM

## 2014-01-22 NOTE — Progress Notes (Signed)
Mri results for the right foot and ankle. She states that the [facility] is helping her back her shoulder her arm as well as her foot. She is very happy with the use of the [facility] at this point.  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 These findings are consistent with our radiographic findings as well as her physical findings on palpation.  Assessment: Osteoarthritis right foot.  Plan: Discussed the etiology pathology conservative versus surgical therapies at this point we are going to continue the use of our [facility] we did discuss the possible need for simple surgery such as an exostectomy as well as a major surgery such as a medial column fusion.

## 2014-02-04 ENCOUNTER — Other Ambulatory Visit: Payer: Self-pay

## 2014-02-04 DIAGNOSIS — Z1231 Encounter for screening mammogram for malignant neoplasm of breast: Secondary | ICD-10-CM

## 2014-02-12 ENCOUNTER — Ambulatory Visit
Admission: RE | Admit: 2014-02-12 | Discharge: 2014-02-12 | Disposition: A | Discharge status: Home / Self Care | Payer: Managed Care, Other (non HMO) | Source: Ambulatory Visit

## 2014-02-12 DIAGNOSIS — Z1231 Encounter for screening mammogram for malignant neoplasm of breast: Secondary | ICD-10-CM

## 2014-02-19 ENCOUNTER — Ambulatory Visit (INDEPENDENT_AMBULATORY_CARE_PROVIDER_SITE_OTHER): Payer: Managed Care, Other (non HMO) | Admitting: Podiatry

## 2014-02-19 ENCOUNTER — Encounter: Payer: Self-pay | Admitting: Podiatry

## 2014-02-19 VITALS — BP 116/66 | HR 69 | Resp 15 | Ht 64.0 in | Wt 174.0 lb

## 2014-02-19 DIAGNOSIS — M778 Other enthesopathies, not elsewhere classified: Secondary | ICD-10-CM

## 2014-02-19 DIAGNOSIS — M779 Enthesopathy, unspecified: Principal | ICD-10-CM

## 2014-02-19 DIAGNOSIS — M7750 Other enthesopathy of unspecified foot: Secondary | ICD-10-CM

## 2014-02-20 NOTE — Progress Notes (Signed)
She presents today stating that she doing much better particularly her arm of her fracture feeling really well utilizing a noted. She's her right foot is doing much better however he still hurts right ear a she points to the medial posterior tibial tendon area as well as the subtalar joint area.  Objective: Pulses are strongly palpable left. She has pain on inversion as well as eversion of the subtalar joint with considerable restriction.  Assessment: Subtalar joint capsulitis possible osteoarthritis.  Plan: Injected the subtalar joint today with 20 mg of Kenalog and local anesthetic after sterile Betadine skin prep and she's to continue her anti-inflammatory therapy. I will followup with her in one month

## 2014-03-19 ENCOUNTER — Encounter: Payer: Self-pay | Admitting: Podiatry

## 2014-03-19 ENCOUNTER — Ambulatory Visit (INDEPENDENT_AMBULATORY_CARE_PROVIDER_SITE_OTHER): Payer: Managed Care, Other (non HMO) | Admitting: Podiatry

## 2014-03-19 VITALS — BP 122/72 | HR 78 | Resp 16

## 2014-03-19 DIAGNOSIS — M76829 Posterior tibial tendinitis, unspecified leg: Secondary | ICD-10-CM

## 2014-03-19 DIAGNOSIS — M76811 Anterior tibial syndrome, right leg: Secondary | ICD-10-CM

## 2014-03-19 NOTE — Progress Notes (Signed)
Presents today states of the lateral aspect of her right foot is doing much better after the injection we have given her last time. She like to have an injection to the medial aspect of the foot right ear a she points the tibialis anterior attachment site.  Objective: Vital signs are stable she is alert oriented x3 pulses are palpable right lower extremity. No pain on palpation the sinus tarsi or on range of motion of the subtalar joint. She does have mild edema overlying the tibialis anterior insertion site and some tenderness on palpation of that area particular round the bone and just proximal to that.  Assessment: Insertional anterior tibial tendinitis right foot.  Plan: Injection Kenalog and local anesthetic to the area of maximal tenderness, did not inject into the tendon. Followup with her in 6 weeks or call sooner if needed

## 2014-04-30 ENCOUNTER — Telehealth: Payer: Self-pay | Admitting: *Deleted

## 2014-04-30 ENCOUNTER — Ambulatory Visit: Payer: Managed Care, Other (non HMO) | Admitting: Podiatry

## 2014-04-30 NOTE — Telephone Encounter (Signed)
I need to get my Mobic refilled.  He had told me that when I ran out to let him know and he'd call in more refills.  But, there seems to be a problem between you and the drugstore.  [facility].  I called and left her a message that Dr. Milinda Pointer had okayed the refill this morning and it had been sent back to the pharmacy.  Sorry for the delay, Dr. Milinda Pointer is only here on certain days.  I was waiting on a response from him.

## 2014-05-02 ENCOUNTER — Other Ambulatory Visit: Payer: Self-pay | Admitting: *Deleted

## 2014-05-02 NOTE — Telephone Encounter (Signed)
Refill request sent for Meloxicam 15mg , quantity of 30.  Dr. Marta Antu the refill plus 3 additional refills.

## 2014-06-25 ENCOUNTER — Ambulatory Visit (INDEPENDENT_AMBULATORY_CARE_PROVIDER_SITE_OTHER): Payer: Managed Care, Other (non HMO) | Admitting: Podiatry

## 2014-06-25 ENCOUNTER — Encounter: Payer: Self-pay | Admitting: Podiatry

## 2014-06-25 VITALS — BP 136/70 | HR 76 | Resp 16

## 2014-06-25 DIAGNOSIS — M76811 Anterior tibial syndrome, right leg: Secondary | ICD-10-CM

## 2014-06-25 DIAGNOSIS — M76829 Posterior tibial tendinitis, unspecified leg: Secondary | ICD-10-CM

## 2014-06-25 NOTE — Progress Notes (Signed)
She presents today for followup of her tibialis anterior tendinitis at its insertion. She states she has good days and bad days.  Objective: Vital signs are stable she is alert and oriented x3. She has pain and swelling on direct palpation of the tibialis anterior at its insertion sites medially. She also has some mild tenderness on palpation of the medial calcaneal tubercle right heel.  Assessment: Pain in limb secondary to tibialis anterior tendinitis right.  Plan: Injected the area today with 2 mg of dexamethasone. I will followup with her in approximately 6 weeks to reevaluate. We discussed appropriate shoe gear stretching exercises ice therapy. Should this not resolve her tenderness an MRI will be necessary to evaluate the tendon once again. We would compared with the previous MRI from March.

## 2014-07-30 ENCOUNTER — Ambulatory Visit (INDEPENDENT_AMBULATORY_CARE_PROVIDER_SITE_OTHER): Payer: Managed Care, Other (non HMO) | Admitting: Podiatry

## 2014-07-30 ENCOUNTER — Encounter: Payer: Self-pay | Admitting: Podiatry

## 2014-07-30 VITALS — BP 115/68 | HR 74 | Resp 17

## 2014-07-30 DIAGNOSIS — M7750 Other enthesopathy of unspecified foot: Secondary | ICD-10-CM

## 2014-07-30 DIAGNOSIS — M778 Other enthesopathies, not elsewhere classified: Secondary | ICD-10-CM

## 2014-07-30 DIAGNOSIS — M76811 Anterior tibial syndrome, right leg: Secondary | ICD-10-CM

## 2014-07-30 DIAGNOSIS — M779 Enthesopathy, unspecified: Secondary | ICD-10-CM

## 2014-07-30 DIAGNOSIS — M76829 Posterior tibial tendinitis, unspecified leg: Secondary | ICD-10-CM

## 2014-07-31 NOTE — Progress Notes (Signed)
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 She states it seems to be doing considerably better though it does hurt right ear a she points to the most tender spot.  Objective: Vital signs are stable she oriented x3. Pulses are palpable bilateral. Pain on palpation to the navicular joint.  Assessment: Capsulitis tendinitis right foot.  Plan: Injected the insertional area of the tibialis anterior and the talonavicular joint area of dexamethasone and local anesthetic. She will continue all conservative therapies. Should this recur another MRI will be performed.

## 2014-08-26 ENCOUNTER — Other Ambulatory Visit: Payer: Self-pay | Admitting: *Deleted

## 2014-08-26 MED ORDER — MELOXICAM 15 MG PO TABS: 15.0000 mg | ORAL_TABLET | Freq: Every day | ORAL | Status: DC

## 2014-08-26 NOTE — Telephone Encounter (Signed)
[facility] [address] sent fax request for mobic 15 mg #30 with 3 refills. Per dr Milinda Pointer refill.

## 2014-09-12 ENCOUNTER — Encounter: Payer: Self-pay | Admitting: Podiatry

## 2014-09-12 ENCOUNTER — Ambulatory Visit (INDEPENDENT_AMBULATORY_CARE_PROVIDER_SITE_OTHER): Payer: Managed Care, Other (non HMO) | Admitting: Podiatry

## 2014-09-12 DIAGNOSIS — M76811 Anterior tibial syndrome, right leg: Secondary | ICD-10-CM

## 2014-09-13 NOTE — Progress Notes (Signed)
She presents today still complaining of pain in the tibialis anterior of the right foot. She states that the injections and the boot and anti-inflammatories and icing in the therapy has failed to render her asymptomatic.  Objective: Vital signs are stable she is alert and oriented 3. She has pain on palpation and dorsiflexion with inversion at the insertion site of the tibialis anterior tendon right foot. She has swelling and inflammation to the level of the ankle.  Assessment: Insertional split tear of the tibialis anterior tendon or the very least some type of tendinopathy.  Plan: Discussed etiology and pathology conservative versus surgical therapies. I think a comparison MRI is necessary at this point. This will be a surgical consideration. Until the MRI is performed conservative therapies should continue.

## 2014-09-20 ENCOUNTER — Telehealth: Payer: Self-pay | Admitting: *Deleted

## 2014-09-20 NOTE — Telephone Encounter (Signed)
I called to get authorization for MRI.  I answered all pertinent clinical questions.  I was told I would receive a fax or phone call within 48 hours with the determination.  Case number is [id number].

## 2014-09-23 ENCOUNTER — Inpatient Hospital Stay: Admission: RE | Admit: 2014-09-23 | Payer: Managed Care, Other (non HMO) | Source: Ambulatory Visit

## 2014-09-23 NOTE — Telephone Encounter (Signed)
Patient's MRI was denied do you want [place] to cancel the MRI?  I told her yes if it was denied.

## 2014-09-26 NOTE — Telephone Encounter (Signed)
I left a message for the patient to call me.  MRI was denied.  Dr. Milinda Pointer wants to refer her to Dr. Raliegh Ip, maybe they can put in a request for the MRI and get it authorized.

## 2014-10-10 NOTE — Telephone Encounter (Signed)
Pt states her MRI was cancelled, possibly due to having one previously this year, what is the next step?

## 2014-10-10 NOTE — Telephone Encounter (Signed)
We need to request another mri with the same dx for appeal.  Her symptoms have worsened and she has a torn tibialis anterior tendon.  This is a surgical consideration.

## 2014-10-15 NOTE — Telephone Encounter (Signed)
Delydia,  Please reapply for this pt's MRI as Dr. Milinda Pointer suggest.  Thanks,  Marcy Siren

## 2014-10-16 ENCOUNTER — Other Ambulatory Visit: Payer: Self-pay | Admitting: Podiatry

## 2014-10-16 DIAGNOSIS — M76811 Anterior tibial syndrome, right leg: Secondary | ICD-10-CM

## 2014-10-16 DIAGNOSIS — T148XXA Other injury of unspecified body region, initial encounter: Secondary | ICD-10-CM

## 2014-10-16 NOTE — Telephone Encounter (Signed)
I called and left her a message that we sent the order to [facility] for a MRI of foot and ankle right.  You should receive a call from them to get it scheduled.  I have also started the process of getting it authorized by your insurance.  It is still pending presently, being reviewed by the nurse at [facility].  Call if you have any questions or concerns

## 2014-10-22 ENCOUNTER — Other Ambulatory Visit: Payer: Self-pay | Admitting: *Deleted

## 2014-10-22 ENCOUNTER — Telehealth: Payer: Self-pay | Admitting: *Deleted

## 2014-10-22 MED ORDER — MELOXICAM 15 MG PO TABS: 15.0000 mg | ORAL_TABLET | Freq: Every day | ORAL | Status: DC

## 2014-10-22 NOTE — Telephone Encounter (Signed)
Referral was sent to Dr. Wylene Simmer per Dr. Milinda Pointer.  I faxed demographics, chart notes and insurance information to him.  I called and left the patient a message that I attempted to get the MRI authorized for the foot and ankle.  Your insurance denied it again.  Dr. Milinda Pointer wants to go in a different direction and refer you to Dr. Wylene Simmer at [facility].  Their address is [address]  Their phone number is [phone].  He said maybe you can request a MRI from them and they can get it authorized.  You should receive a call from them to schedule an appointment.  Please call if you have any questions.  I informed Jocelyn Lamer at [facility] to cancel patient's MRI scheduled for tomorrow..  It was denied by insurance.

## 2014-10-22 NOTE — Telephone Encounter (Signed)
Pt is scheduled for 10/23/2014 for MRI of right ankle with and without contrast, pt's [employer] needs prior authorization.

## 2014-10-22 NOTE — Telephone Encounter (Signed)
[facility] [address] sent refill req for meloxicam 15 mg per dr Milinda Pointer refill #90 with 3 refills take one by mouth daily

## 2014-10-23 ENCOUNTER — Other Ambulatory Visit: Payer: Managed Care, Other (non HMO)

## 2015-01-24 ENCOUNTER — Other Ambulatory Visit: Payer: Self-pay

## 2015-01-24 DIAGNOSIS — Z1239 Encounter for other screening for malignant neoplasm of breast: Secondary | ICD-10-CM

## 2015-02-14 ENCOUNTER — Ambulatory Visit
Admission: RE | Admit: 2015-02-14 | Discharge: 2015-02-14 | Disposition: A | Discharge status: Home / Self Care | Payer: Managed Care, Other (non HMO) | Source: Ambulatory Visit

## 2015-02-14 ENCOUNTER — Encounter (INDEPENDENT_AMBULATORY_CARE_PROVIDER_SITE_OTHER): Payer: Self-pay

## 2015-02-14 ENCOUNTER — Other Ambulatory Visit: Payer: Self-pay

## 2015-02-14 DIAGNOSIS — Z1231 Encounter for screening mammogram for malignant neoplasm of breast: Secondary | ICD-10-CM

## 2015-08-11 ENCOUNTER — Telehealth: Payer: Self-pay | Admitting: Internal Medicine

## 2015-08-11 NOTE — Telephone Encounter (Signed)
Holly Mckinney, you can schedule for 8:15 no 8:00 AM physicals.

## 2015-08-11 NOTE — Telephone Encounter (Signed)
Pt would like to re-est and have an appt on 10/10/15 at 8am. Can I sch?

## 2015-08-12 NOTE — Telephone Encounter (Signed)
Left message on machine for pt to call back.

## 2015-08-12 NOTE — Telephone Encounter (Signed)
Pt is aware of the appt 10/10/15 8:15am

## 2015-10-10 ENCOUNTER — Ambulatory Visit: Payer: Managed Care, Other (non HMO) | Admitting: Internal Medicine

## 2015-11-24 ENCOUNTER — Ambulatory Visit (INDEPENDENT_AMBULATORY_CARE_PROVIDER_SITE_OTHER): Payer: Managed Care, Other (non HMO) | Admitting: Internal Medicine

## 2015-11-24 ENCOUNTER — Encounter: Payer: Self-pay | Admitting: Internal Medicine

## 2015-11-24 ENCOUNTER — Other Ambulatory Visit: Payer: Self-pay | Admitting: *Deleted

## 2015-11-24 VITALS — BP 124/78 | HR 72 | Temp 98.0°F | Resp 20 | Ht 62.5 in | Wt 165.0 lb

## 2015-11-24 DIAGNOSIS — Z23 Encounter for immunization: Secondary | ICD-10-CM | POA: Diagnosis not present

## 2015-11-24 DIAGNOSIS — K5909 Other constipation: Secondary | ICD-10-CM

## 2015-11-24 DIAGNOSIS — Q742 Other congenital malformations of lower limb(s), including pelvic girdle: Secondary | ICD-10-CM | POA: Diagnosis not present

## 2015-11-24 DIAGNOSIS — Z0000 Encounter for general adult medical examination without abnormal findings: Secondary | ICD-10-CM | POA: Diagnosis not present

## 2015-11-24 LAB — CBC WITH DIFFERENTIAL/PLATELET
BASOS ABS: 0 10*3/uL (ref 0.0–0.1)
Basophils Relative: 0.6 % (ref 0.0–3.0)
EOS ABS: 0.1 10*3/uL (ref 0.0–0.7)
Eosinophils Relative: 1.4 % (ref 0.0–5.0)
HEMATOCRIT: 41.7 % (ref 36.0–46.0)
HEMOGLOBIN: 13.8 g/dL (ref 12.0–15.0)
LYMPHS PCT: 26.7 % (ref 12.0–46.0)
Lymphs Abs: 1.5 10*3/uL (ref 0.7–4.0)
MCHC: 33 g/dL (ref 30.0–36.0)
MCV: 92.8 fl (ref 78.0–100.0)
Monocytes Absolute: 0.7 10*3/uL (ref 0.1–1.0)
Monocytes Relative: 11.9 % (ref 3.0–12.0)
NEUTROS ABS: 3.4 10*3/uL (ref 1.4–7.7)
Neutrophils Relative %: 59.4 % (ref 43.0–77.0)
PLATELETS: 258 10*3/uL (ref 150.0–400.0)
RBC: 4.49 Mil/uL (ref 3.87–5.11)
RDW: 14 % (ref 11.5–15.5)
WBC: 5.8 10*3/uL (ref 4.0–10.5)

## 2015-11-24 LAB — LIPID PANEL
CHOLESTEROL: 221 mg/dL — AB (ref 0–200)
HDL: 84.2 mg/dL (ref >39.00)
LDL Cholesterol: 122 mg/dL — ABNORMAL HIGH (ref 0–99)
NonHDL: 136.67
TRIGLYCERIDES: 73 mg/dL (ref 0.0–149.0)
Total CHOL/HDL Ratio: 3
VLDL: 14.6 mg/dL (ref 0.0–40.0)

## 2015-11-24 LAB — TSH: TSH: 1.74 u[IU]/mL (ref 0.35–4.50)

## 2015-11-24 LAB — COMPREHENSIVE METABOLIC PANEL
ALBUMIN: 4.3 g/dL (ref 3.5–5.2)
ALK PHOS: 61 U/L (ref 39–117)
ALT: 10 U/L (ref 0–35)
AST: 13 U/L (ref 0–37)
BILIRUBIN TOTAL: 0.3 mg/dL (ref 0.2–1.2)
BUN: 21 mg/dL (ref 6–23)
CALCIUM: 9.3 mg/dL (ref 8.4–10.5)
CO2: 23 mEq/L (ref 19–32)
CREATININE: 0.63 mg/dL (ref 0.40–1.20)
Chloride: 106 mEq/L (ref 96–112)
GFR: 105.99 mL/min (ref >60.00)
Glucose, Bld: 92 mg/dL (ref 70–99)
Potassium: 4.9 mEq/L (ref 3.5–5.1)
Sodium: 143 mEq/L (ref 135–145)
TOTAL PROTEIN: 7.2 g/dL (ref 6.0–8.3)

## 2015-11-24 MED ORDER — MELOXICAM 15 MG PO TABS: 15.0000 mg | ORAL_TABLET | Freq: Every day | ORAL | Status: DC

## 2015-11-24 NOTE — Progress Notes (Signed)
Pre visit review using our clinic review tool, if applicable. No additional management support is needed unless otherwise documented below in the visit note. 

## 2015-11-24 NOTE — Progress Notes (Signed)
   Subjective:    Patient ID: Holly Mckinney, female    DOB: 1965-12-13, 50 y.o.   MRN: [id number]  HPI   50 year old patient who is seen today for a preventive health exam ; she enjoys excellent health and is followed by gynecology.  She did have a hysterectomy performed in 2014.  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  She is considering surgery but will be near nonweightbearing for 9 months.  She is on chronic Mobic  Wt Readings from Last 3 Encounters:  11/24/15 165 lb (74.844 kg)  02/19/14 174 lb (78.926 kg)  01/01/14 175 lb (79.379 kg)    Current Allergies (reviewed today): No known allergies   Past Medical History:  Reviewed history and no changes required:  carpal tunnel syndrome  chronic constipation  Past Surgical History:  Tonsillectomy age 9  gravida two, para two, abortus one  C-section x 2    Status post hysterectomy 2014   Family History:  father chronic back pain  mother in good health    Grandmother has senile dementia the Alzheimer's type  Social History:  Married  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   HENT: Negative for congestion, dental problem, hearing loss, rhinorrhea, sinus pressure, sore throat and tinnitus.   Eyes: Negative for pain, discharge and visual disturbance.  Respiratory: Negative for cough and shortness of breath.   Cardiovascular: Negative for chest pain, palpitations and leg swelling.  Gastrointestinal: Negative for nausea, vomiting, abdominal pain, diarrhea, constipation, blood in stool and abdominal distention.  Genitourinary: Negative for dysuria, urgency, frequency, hematuria, flank pain, vaginal bleeding, vaginal discharge, difficulty urinating, vaginal pain and pelvic pain.  Musculoskeletal: Negative for joint swelling, arthralgias and gait problem.       Chronic right  foot pain.  Dorsal aspect  Skin: Negative for rash.  Neurological: Negative for dizziness, syncope, speech difficulty, weakness, numbness and headaches.  Hematological: Negative for adenopathy.  Psychiatric/Behavioral: Negative for behavioral problems, dysphoric mood and agitation. The patient is not nervous/anxious.        Objective:   Physical Exam  Constitutional: She is oriented to person, place, and time. She appears well-developed and well-nourished.  HENT:  Head: Normocephalic.  Right Ear: External ear normal.  Left Ear: External ear normal.  Mouth/Throat: Oropharynx is clear and moist.  Eyes: Conjunctivae and EOM are normal. Pupils are equal, round, and reactive to light.  Neck: Normal range of motion. Neck supple. No thyromegaly present.  Cardiovascular: Normal rate, regular rhythm, normal heart sounds and intact distal pulses.   Pulmonary/Chest: Effort normal and breath sounds normal.  Abdominal: Soft. Bowel sounds are normal. She exhibits no mass. There is no tenderness.  Musculoskeletal: Normal range of motion.  Lymphadenopathy:    She has no cervical adenopathy.  Neurological: She is alert and oriented to person, place, and time.  Skin: Skin is warm and dry. No rash noted.  Psychiatric: She has a normal mood and affect. Her behavior is normal.          Assessment & Plan:   Preventive health examination  Right talonavicular arthritis  We'll check updated lab Schedule screening colonoscopy  Follow-up GYN

## 2015-11-24 NOTE — Addendum Note (Signed)
Addended by: Marian Sorrow on: 11/24/2015 11:38 AM   Modules accepted: Orders

## 2015-11-24 NOTE — Patient Instructions (Signed)

## 2015-12-11 ENCOUNTER — Other Ambulatory Visit: Payer: Self-pay | Admitting: Podiatry

## 2015-12-25 ENCOUNTER — Encounter: Payer: Self-pay | Admitting: Internal Medicine

## 2016-02-16 ENCOUNTER — Other Ambulatory Visit: Payer: Self-pay

## 2016-02-16 DIAGNOSIS — Z1231 Encounter for screening mammogram for malignant neoplasm of breast: Secondary | ICD-10-CM

## 2016-03-15 ENCOUNTER — Ambulatory Visit: Payer: Managed Care, Other (non HMO)

## 2016-03-15 ENCOUNTER — Encounter: Payer: Self-pay | Admitting: Gastroenterology

## 2016-04-02 ENCOUNTER — Ambulatory Visit
Admission: RE | Admit: 2016-04-02 | Discharge: 2016-04-02 | Disposition: A | Discharge status: Home / Self Care | Payer: Managed Care, Other (non HMO) | Source: Ambulatory Visit

## 2016-04-02 DIAGNOSIS — Z1231 Encounter for screening mammogram for malignant neoplasm of breast: Secondary | ICD-10-CM

## 2016-04-08 ENCOUNTER — Ambulatory Visit (AMBULATORY_SURGERY_CENTER): Payer: Self-pay

## 2016-04-08 VITALS — Ht 64.0 in | Wt 160.6 lb

## 2016-04-08 DIAGNOSIS — Z1211 Encounter for screening for malignant neoplasm of colon: Secondary | ICD-10-CM

## 2016-04-08 MED ORDER — SUPREP BOWEL PREP KIT 17.5-3.13-1.6 GM/177ML PO SOLN: 1.0000 | Freq: Once | ORAL | Status: DC

## 2016-04-08 NOTE — Progress Notes (Signed)
No allergies to eggs or soy No past problems with anesthesia except PONV No home oxygen No diet meds  Has email and internet; registered emmi

## 2016-04-09 ENCOUNTER — Encounter: Payer: Self-pay | Admitting: Gastroenterology

## 2016-04-22 ENCOUNTER — Ambulatory Visit (AMBULATORY_SURGERY_CENTER): Payer: Managed Care, Other (non HMO) | Admitting: Gastroenterology

## 2016-04-22 ENCOUNTER — Encounter: Payer: Self-pay | Admitting: Gastroenterology

## 2016-04-22 VITALS — BP 111/43 | HR 55 | Temp 98.4°F | Resp 12 | Ht 64.0 in | Wt 160.0 lb

## 2016-04-22 DIAGNOSIS — D120 Benign neoplasm of cecum: Secondary | ICD-10-CM | POA: Diagnosis not present

## 2016-04-22 DIAGNOSIS — D122 Benign neoplasm of ascending colon: Secondary | ICD-10-CM

## 2016-04-22 DIAGNOSIS — Z1211 Encounter for screening for malignant neoplasm of colon: Secondary | ICD-10-CM

## 2016-04-22 MED ORDER — SODIUM CHLORIDE 0.9 % IV SOLN: 500.0000 mL | INTRAVENOUS | Status: DC

## 2016-04-22 NOTE — Progress Notes (Signed)
Called to room to assist during endoscopic procedure.  Patient ID and intended procedure confirmed with present staff. Received instructions for my participation in the procedure from the performing physician.  

## 2016-04-22 NOTE — Progress Notes (Signed)
Report to PACU, RN, vss, BBS= Clear.  

## 2016-04-22 NOTE — Op Note (Signed)
[place] Patient Name: Holly Mckinney Procedure Date: 04/22/2016 1:01 PM MRN: [id number] Endoscopist: Mauri Pole , MD Age: 50 Referring MD:  Date of Birth: 01-15-1966 Gender: Female Procedure:                Colonoscopy Indications:              Screening for colorectal malignant neoplasm, This                            is the patient's first colonoscopy Medicines:                Monitored Anesthesia Care Procedure:                Pre-Anesthesia Assessment:                           - Prior to the procedure, a History and Physical                            was performed, and patient medications and                            allergies were reviewed. The patient's tolerance of                            previous anesthesia was also reviewed. The risks                            and benefits of the procedure and the sedation                            options and risks were discussed with the patient.                            All questions were answered, and informed consent                            was obtained. Prior Anticoagulants: The patient has                            taken no previous anticoagulant or antiplatelet                            agents. ASA Grade Assessment: II - A patient with                            mild systemic disease. After reviewing the risks                            and benefits, the patient was deemed in                            satisfactory condition to undergo the procedure.  After obtaining informed consent, the colonoscope                            was passed under direct vision. Throughout the                            procedure, the patient's blood pressure, pulse, and                            oxygen saturations were monitored continuously. The                            Model CF-HQ190L [id number]) scope was introduced                            through the anus and advanced to the the  terminal                            ileum, with identification of the appendiceal                            orifice and IC valve. The colonoscopy was performed                            without difficulty. The patient tolerated the                            procedure well. The quality of the bowel                            preparation was good. The terminal ileum, ileocecal                            valve, appendiceal orifice, and rectum were                            photographed. Scope In: 1:30:42 PM Scope Out: 1:46:01 PM Scope Withdrawal Time: 0 hours 11 minutes 30 seconds  Total Procedure Duration: 0 hours 15 minutes 19 seconds  Findings:                 The perianal and digital rectal examinations were                            normal.                           Two flat polyps were found in the ascending colon                            and cecum. The polyps were 7 to 12 mm in size.                            These polyps were removed with a cold snare.  Resection and retrieval were complete.                           A 4 mm polyp was found in the cecum. The polyp was                            sessile. The polyp was removed with a cold biopsy                            forceps. Resection and retrieval were complete.                           A few small-mouthed diverticula were found in the                            sigmoid colon.                           Non-bleeding internal hemorrhoids were found during                            retroflexion. The hemorrhoids were small.                           A diffuse area of severe melanosis was found in the                            entire colon. Complications:            No immediate complications. Estimated Blood Loss:     Estimated blood loss was minimal. Impression:               - Two 7 to 12 mm polyps in the ascending colon and                            in the cecum, removed with a cold snare.  Resected                            and retrieved.                           - One 4 mm polyp in the cecum, removed with a cold                            biopsy forceps. Resected and retrieved.                           - Diverticulosis in the sigmoid colon.                           - Non-bleeding internal hemorrhoids.                           - Melanosis in the colon. Recommendation:           - Patient  has a contact number available for                            emergencies. The signs and symptoms of potential                            delayed complications were discussed with the                            patient. Return to normal activities tomorrow.                            Written discharge instructions were provided to the                            patient.                           - Resume previous diet.                           - Continue present medications.                           -Avoid herbal tea/Senna                           - Await pathology results.                           - Repeat colonoscopy in 3 - 5 years for                            surveillance.                           - Return to GI clinic PRN. Mauri Pole, MD 04/22/2016 1:58:49 PM This report has been signed electronically.

## 2016-04-22 NOTE — Patient Instructions (Signed)
Impression/recommendations:  Polyps (handout given) Diverticulosis (handout given) High Fiber Diet (handout given) Hemorrhoids (handout given)  YOU HAD AN ENDOSCOPIC PROCEDURE TODAY AT [place]:   Refer to the procedure report that was given to you for any specific questions about what was found during the examination.  If the procedure report does not answer your questions, please call your gastroenterologist to clarify.  If you requested that your care partner not be given the details of your procedure findings, then the procedure report has been included in a sealed envelope for you to review at your convenience later.  YOU SHOULD EXPECT: Some feelings of bloating in the abdomen. Passage of more gas than usual.  Walking can help get rid of the air that was put into your GI tract during the procedure and reduce the bloating. If you had a lower endoscopy (such as a colonoscopy or flexible sigmoidoscopy) you may notice spotting of blood in your stool or on the toilet paper. If you underwent a bowel prep for your procedure, you may not have a normal bowel movement for a few days.  Please Note:  You might notice some irritation and congestion in your nose or some drainage.  This is from the oxygen used during your procedure.  There is no need for concern and it should clear up in a day or so.  SYMPTOMS TO REPORT IMMEDIATELY:   Following lower endoscopy (colonoscopy or flexible sigmoidoscopy):  Excessive amounts of blood in the stool  Significant tenderness or worsening of abdominal pains  Swelling of the abdomen that is new, acute  Fever of 100F or higher  For urgent or emergent issues, a gastroenterologist can be reached at any hour by calling [phone].   DIET: Your first meal following the procedure should be a small meal and then it is ok to progress to your normal diet. Heavy or fried foods are harder to digest and may make you feel nauseous or bloated.   Likewise, meals heavy in dairy and vegetables can increase bloating.  Drink plenty of fluids but you should avoid alcoholic beverages for 24 hours.  ACTIVITY:  You should plan to take it easy for the rest of today and you should NOT DRIVE or use heavy machinery until tomorrow (because of the sedation medicines used during the test).    FOLLOW UP: Our staff will call the number listed on your records the next business day following your procedure to check on you and address any questions or concerns that you may have regarding the information given to you following your procedure. If we do not reach you, we will leave a message.  However, if you are feeling well and you are not experiencing any problems, there is no need to return our call.  We will assume that you have returned to your regular daily activities without incident.  If any biopsies were taken you will be contacted by phone or by letter within the next 1-3 weeks.  Please call us at [phone] if you have not heard about the biopsies in 3 weeks.    SIGNATURES/CONFIDENTIALITY: You and/or your care partner have signed paperwork which will be entered into your electronic medical record.  These signatures attest to the fact that that the information above on your After Visit Summary has been reviewed and is understood.  Full responsibility of the confidentiality of this discharge information lies with you and/or your care-partner.

## 2016-04-23 ENCOUNTER — Telehealth: Payer: Self-pay

## 2016-04-23 NOTE — Telephone Encounter (Signed)
  Follow up Call-  Call back number 04/22/2016  Post procedure Call Back phone  # [phone]  Permission to leave phone message Yes     Patient questions:  Do you have a fever, pain , or abdominal swelling? No. Pain Score  0 *  Have you tolerated food without any problems? Yes.    Have you been able to return to your normal activities? Yes.    Do you have any questions about your discharge instructions: Diet   No. Medications  No. Follow up visit  No.  Do you have questions or concerns about your Care? No.  Actions: * If pain score is 4 or above: No action needed, pain <4.

## 2016-04-28 ENCOUNTER — Encounter: Payer: Self-pay | Admitting: Internal Medicine

## 2016-04-29 ENCOUNTER — Encounter: Payer: Self-pay | Admitting: Gastroenterology

## 2017-01-02 ENCOUNTER — Other Ambulatory Visit: Payer: Self-pay | Admitting: Internal Medicine

## 2017-03-04 ENCOUNTER — Other Ambulatory Visit: Payer: Self-pay | Admitting: Obstetrics and Gynecology

## 2017-03-04 DIAGNOSIS — Z1231 Encounter for screening mammogram for malignant neoplasm of breast: Secondary | ICD-10-CM

## 2017-04-05 ENCOUNTER — Ambulatory Visit
Admission: RE | Admit: 2017-04-05 | Discharge: 2017-04-05 | Disposition: A | Discharge status: Home / Self Care | Payer: Managed Care, Other (non HMO) | Source: Ambulatory Visit | Attending: Obstetrics and Gynecology | Admitting: Obstetrics and Gynecology

## 2017-04-05 DIAGNOSIS — Z1231 Encounter for screening mammogram for malignant neoplasm of breast: Secondary | ICD-10-CM

## 2017-04-27 LAB — CBC AND DIFFERENTIAL: HEMOGLOBIN: 15.1 (ref 12.0–16.0)

## 2017-05-02 ENCOUNTER — Encounter: Payer: Self-pay | Admitting: Family Medicine

## 2017-05-13 ENCOUNTER — Ambulatory Visit (INDEPENDENT_AMBULATORY_CARE_PROVIDER_SITE_OTHER): Payer: Managed Care, Other (non HMO) | Admitting: Internal Medicine

## 2017-05-13 ENCOUNTER — Encounter: Payer: Self-pay | Admitting: Internal Medicine

## 2017-05-13 VITALS — BP 108/76 | HR 68 | Temp 98.1°F | Ht 63.0 in | Wt 173.0 lb

## 2017-05-13 DIAGNOSIS — Z0000 Encounter for general adult medical examination without abnormal findings: Secondary | ICD-10-CM | POA: Diagnosis not present

## 2017-05-13 LAB — COMPREHENSIVE METABOLIC PANEL
ALT: 10 U/L (ref 0–35)
AST: 12 U/L (ref 0–37)
Albumin: 4.5 g/dL (ref 3.5–5.2)
Alkaline Phosphatase: 56 U/L (ref 39–117)
BUN: 16 mg/dL (ref 6–23)
CHLORIDE: 101 meq/L (ref 96–112)
CO2: 31 meq/L (ref 19–32)
Calcium: 9.6 mg/dL (ref 8.4–10.5)
Creatinine, Ser: 0.72 mg/dL (ref 0.40–1.20)
GFR: 90.33 mL/min (ref >60.00)
GLUCOSE: 101 mg/dL — AB (ref 70–99)
POTASSIUM: 4.4 meq/L (ref 3.5–5.1)
Sodium: 139 mEq/L (ref 135–145)
Total Bilirubin: 0.6 mg/dL (ref 0.2–1.2)
Total Protein: 7.1 g/dL (ref 6.0–8.3)

## 2017-05-13 LAB — CBC WITH DIFFERENTIAL/PLATELET
BASOS PCT: 0.7 % (ref 0.0–3.0)
Basophils Absolute: 0 10*3/uL (ref 0.0–0.1)
EOS PCT: 0.7 % (ref 0.0–5.0)
Eosinophils Absolute: 0 10*3/uL (ref 0.0–0.7)
HEMATOCRIT: 41.6 % (ref 36.0–46.0)
Hemoglobin: 14 g/dL (ref 12.0–15.0)
Lymphocytes Relative: 30.7 % (ref 12.0–46.0)
Lymphs Abs: 1.5 10*3/uL (ref 0.7–4.0)
MCHC: 33.6 g/dL (ref 30.0–36.0)
MCV: 93.2 fl (ref 78.0–100.0)
MONOS PCT: 7.4 % (ref 3.0–12.0)
Monocytes Absolute: 0.4 10*3/uL (ref 0.1–1.0)
NEUTROS ABS: 3.1 10*3/uL (ref 1.4–7.7)
NEUTROS PCT: 60.5 % (ref 43.0–77.0)
PLATELETS: 261 10*3/uL (ref 150.0–400.0)
RBC: 4.46 Mil/uL (ref 3.87–5.11)
RDW: 13 % (ref 11.5–15.5)
WBC: 5.1 10*3/uL (ref 4.0–10.5)

## 2017-05-13 LAB — LIPID PANEL
CHOL/HDL RATIO: 3
Cholesterol: 239 mg/dL — ABNORMAL HIGH (ref 0–200)
HDL: 80.9 mg/dL (ref >39.00)
LDL CALC: 136 mg/dL — AB (ref 0–99)
NONHDL: 157.81
Triglycerides: 109 mg/dL (ref 0.0–149.0)
VLDL: 21.8 mg/dL (ref 0.0–40.0)

## 2017-05-13 LAB — TSH: TSH: 1.59 u[IU]/mL (ref 0.35–4.50)

## 2017-05-13 MED ORDER — MELOXICAM 15 MG PO TABS: 15.0000 mg | ORAL_TABLET | Freq: Every day | ORAL | 4 refills | Status: DC

## 2017-05-13 MED ORDER — LINACLOTIDE 290 MCG PO CAPS: 290.0000 ug | ORAL_CAPSULE | Freq: Every day | ORAL | 6 refills | Status: AC

## 2017-05-13 MED ORDER — POLYETHYLENE GLYCOL 3350 17 GM/SCOOP PO POWD: 17.0000 g | Freq: Two times a day (BID) | ORAL | 1 refills | Status: AC | PRN

## 2017-05-13 NOTE — Progress Notes (Signed)
Subjective:    Patient ID: Holly Mckinney, female    DOB: 12/10/1965, 51 y.o.   MRN: [id number]  HPI  51 year old patient who is seen today for a preventive health examination. She enjoys excellent health.  Her only concern today is chronic constipation. She has had a fairly recent colonoscopy.  She has been given a trial of Benefiber and MiraLAX with suboptimal results At the time her colonoscopy.  There was evidence of chronic laxative use.  She is asking about a trial of linzess  Past Medical History:  Diagnosis Date  . Family history of anesthesia complication    grandfather  . PONV (postoperative nausea and vomiting)      Social History   Social History  . Marital status: Married    Spouse name: N/A  . Number of children: N/A  . Years of education: N/A   Occupational History  . Not on file.   Social History Main Topics  . Smoking status: Never Smoker  . Smokeless tobacco: Never Used  . Alcohol use 0.0 oz/week     Comment: glass of wine  . Drug use: No  . Sexual activity: Yes    Partners: Male    Birth control/ protection: Surgical   Other Topics Concern  . Not on file   Social History Narrative  . No narrative on file    Past Surgical History:  Procedure Laterality Date  . ABDOMINAL HYSTERECTOMY  12/13/2012   Procedure: HYSTERECTOMY ABDOMINAL;  Surgeon: Melina Schools, MD;  Location: [facility];  Service: Gynecology;  Laterality: N/A;  with Bilateral Salpingectomy  . CESAREAN SECTION  1994  . CESAREAN SECTION  2000  . TONSILLECTOMY      Family History  Problem Relation Age of Onset  . Colon cancer Neg Hx     No Known Allergies  Current Outpatient Prescriptions on File Prior to Visit  Medication Sig Dispense Refill  . acyclovir (ZOVIRAX) 200 MG capsule TAKE ONE CAPSULE BY MOUTH 5 TIMES DAILY FOR 10 DAYS  0   No current facility-administered medications on file prior to visit.     BP 108/76 (BP Location: Left Arm, Patient Position: Sitting, Cuff  Size: Normal)   Pulse 68   Temp 98.1 F (36.7 C) (Oral)   Ht 5\' 3"  (1.6 m)   Wt 173 lb (78.5 kg)   LMP 11/09/2012 (Exact Date)   SpO2 97%   BMI 30.65 kg/m     Review of Systems  Constitutional: Negative.   HENT: Negative for congestion, dental problem, hearing loss, rhinorrhea, sinus pressure, sore throat and tinnitus.   Eyes: Negative for pain, discharge and visual disturbance.  Respiratory: Negative for cough and shortness of breath.   Cardiovascular: Negative for chest pain, palpitations and leg swelling.  Gastrointestinal: Positive for constipation. Negative for abdominal distention, abdominal pain, blood in stool, diarrhea, nausea and vomiting.  Genitourinary: Negative for difficulty urinating, dysuria, flank pain, frequency, hematuria, pelvic pain, urgency, vaginal bleeding, vaginal discharge and vaginal pain.  Musculoskeletal: Negative for arthralgias, gait problem and joint swelling.  Skin: Negative for rash.  Neurological: Negative for dizziness, syncope, speech difficulty, weakness, numbness and headaches.  Hematological: Negative for adenopathy.  Psychiatric/Behavioral: Negative for agitation, behavioral problems and dysphoric mood. The patient is not nervous/anxious.        Objective:   Physical Exam  Constitutional: She is oriented to person, place, and time. She appears well-developed and well-nourished.   Weight 1 7 3   Blood pressure low normal  HENT:  Head: Normocephalic.  Right Ear: External ear normal.  Left Ear: External ear normal.  Mouth/Throat: Oropharynx is clear and moist.  Eyes: Conjunctivae and EOM are normal. Pupils are equal, round, and reactive to light.  Neck: Normal range of motion. Neck supple. No thyromegaly present.  Cardiovascular: Normal rate, regular rhythm, normal heart sounds and intact distal pulses.   Pulmonary/Chest: Effort normal and breath sounds normal.  Abdominal: Soft. Bowel sounds are normal. She exhibits no mass. There is  no tenderness.  Musculoskeletal: Normal range of motion.  Lymphadenopathy:    She has no cervical adenopathy.  Neurological: She is alert and oriented to person, place, and time.  Skin: Skin is warm and dry. No rash noted.  Psychiatric: She has a normal mood and affect. Her behavior is normal.          Assessment & Plan:   Preventive health examination Chronic constipation.  Issue discussed.  The patient will liberalize her fluid intake and fiber consumption.  We'll increase dosing of polyethylene glycol. If these measures are not effective or not well tolerated.  Will give a trial of [place]

## 2017-05-13 NOTE — Patient Instructions (Addendum)
Health Maintenance for Postmenopausal Women Menopause is a normal process in which your reproductive ability comes to an end. This process happens gradually over a span of months to years, usually between the ages of 22 and 9. Menopause is complete when you have missed 12 consecutive menstrual periods. It is important to talk with your health care provider about some of the most common conditions that affect postmenopausal women, such as heart disease, cancer, and bone loss (osteoporosis). Adopting a healthy lifestyle and getting preventive care can help to promote your health and wellness. Those actions can also lower your chances of developing some of these common conditions. What should I know about menopause? During menopause, you may experience a number of symptoms, such as:  Moderate-to-severe hot flashes.  Night sweats.  Decrease in sex drive.  Mood swings.  Headaches.  Tiredness.  Irritability.  Memory problems.  Insomnia.  Choosing to treat or not to treat menopausal changes is an individual decision that you make with your health care provider. What should I know about hormone replacement therapy and supplements? Hormone therapy products are effective for treating symptoms that are associated with menopause, such as hot flashes and night sweats. Hormone replacement carries certain risks, especially as you become older. If you are thinking about using estrogen or estrogen with progestin treatments, discuss the benefits and risks with your health care provider. What should I know about heart disease and stroke? Heart disease, heart attack, and stroke become more likely as you age. This may be due, in part, to the hormonal changes that your body experiences during menopause. These can affect how your body processes dietary fats, triglycerides, and cholesterol. Heart attack and stroke are both medical emergencies. There are many things that you can do to help prevent heart disease  and stroke:  Have your blood pressure checked at least every 1-2 years. High blood pressure causes heart disease and increases the risk of stroke.  If you are 53-22 years old, ask your health care provider if you should take aspirin to prevent a heart attack or a stroke.  Do not use any tobacco products, including cigarettes, chewing tobacco, or electronic cigarettes. If you need help quitting, ask your health care provider.  It is important to eat a healthy diet and maintain a healthy weight. ? Be sure to include plenty of vegetables, fruits, low-fat dairy products, and lean protein. ? Avoid eating foods that are high in solid fats, added sugars, or salt (sodium).  Get regular exercise. This is one of the most important things that you can do for your health. ? Try to exercise for at least 150 minutes each week. The type of exercise that you do should increase your heart rate and make you sweat. This is known as moderate-intensity exercise. ? Try to do strengthening exercises at least twice each week. Do these in addition to the moderate-intensity exercise.  Know your numbers.Ask your health care provider to check your cholesterol and your blood glucose. Continue to have your blood tested as directed by your health care provider.  What should I know about cancer screening? There are several types of cancer. Take the following steps to reduce your risk and to catch any cancer development as early as possible. Breast Cancer  Practice breast self-awareness. ? This means understanding how your breasts normally appear and feel. ? It also means doing regular breast self-exams. Let your health care provider know about any changes, no matter how small.  If you are 40  or older, have a clinician do a breast exam (clinical breast exam or CBE) every year. Depending on your age, family history, and medical history, it may be recommended that you also have a yearly breast X-ray (mammogram).  If you  have a family history of breast cancer, talk with your health care provider about genetic screening.  If you are at high risk for breast cancer, talk with your health care provider about having an MRI and a mammogram every year.  Breast cancer (BRCA) gene test is recommended for women who have family members with BRCA-related cancers. Results of the assessment will determine the need for genetic counseling and BRCA1 and for BRCA2 testing. BRCA-related cancers include these types: ? Breast. This occurs in males or females. ? Ovarian. ? Tubal. This may also be called fallopian tube cancer. ? Cancer of the abdominal or pelvic lining (peritoneal cancer). ? Prostate. ? Pancreatic.  Cervical, Uterine, and Ovarian Cancer Your health care provider may recommend that you be screened regularly for cancer of the pelvic organs. These include your ovaries, uterus, and vagina. This screening involves a pelvic exam, which includes checking for microscopic changes to the surface of your cervix (Pap test).  For women ages 21-65, health care providers may recommend a pelvic exam and a Pap test every three years. For women ages 79-65, they may recommend the Pap test and pelvic exam, combined with testing for human papilloma virus (HPV), every five years. Some types of HPV increase your risk of cervical cancer. Testing for HPV may also be done on women of any age who have unclear Pap test results.  Other health care providers may not recommend any screening for nonpregnant women who are considered low risk for pelvic cancer and have no symptoms. Ask your health care provider if a screening pelvic exam is right for you.  If you have had past treatment for cervical cancer or a condition that could lead to cancer, you need Pap tests and screening for cancer for at least 20 years after your treatment. If Pap tests have been discontinued for you, your risk factors (such as having a new sexual partner) need to be  reassessed to determine if you should start having screenings again. Some women have medical problems that increase the chance of getting cervical cancer. In these cases, your health care provider may recommend that you have screening and Pap tests more often.  If you have a family history of uterine cancer or ovarian cancer, talk with your health care provider about genetic screening.  If you have vaginal bleeding after reaching menopause, tell your health care provider.  There are currently no reliable tests available to screen for ovarian cancer.  Lung Cancer Lung cancer screening is recommended for adults 69-62 years old who are at high risk for lung cancer because of a history of smoking. A yearly low-dose CT scan of the lungs is recommended if you:  Currently smoke.  Have a history of at least 30 pack-years of smoking and you currently smoke or have quit within the past 15 years. A pack-year is smoking an average of one pack of cigarettes per day for one year.  Yearly screening should:  Continue until it has been 15 years since you quit.  Stop if you develop a health problem that would prevent you from having lung cancer treatment.  Colorectal Cancer  This type of cancer can be detected and can often be prevented.  Routine colorectal cancer screening usually begins at  age 42 and continues through age 45.  If you have risk factors for colon cancer, your health care provider may recommend that you be screened at an earlier age.  If you have a family history of colorectal cancer, talk with your health care provider about genetic screening.  Your health care provider may also recommend using home test kits to check for hidden blood in your stool.  A small camera at the end of a tube can be used to examine your colon directly (sigmoidoscopy or colonoscopy). This is done to check for the earliest forms of colorectal cancer.  Direct examination of the colon should be repeated every  5-10 years until age 71. However, if early forms of precancerous polyps or small growths are found or if you have a family history or genetic risk for colorectal cancer, you may need to be screened more often.  Skin Cancer  Check your skin from head to toe regularly.  Monitor any moles. Be sure to tell your health care provider: ? About any new moles or changes in moles, especially if there is a change in a mole's shape or color. ? If you have a mole that is larger than the size of a pencil eraser.  If any of your family members has a history of skin cancer, especially at a young age, talk with your health care provider about genetic screening.  Always use sunscreen. Apply sunscreen liberally and repeatedly throughout the day.  Whenever you are outside, protect yourself by wearing long sleeves, pants, a wide-brimmed hat, and sunglasses.  What should I know about osteoporosis? Osteoporosis is a condition in which bone destruction happens more quickly than new bone creation. After menopause, you may be at an increased risk for osteoporosis. To help prevent osteoporosis or the bone fractures that can happen because of osteoporosis, the following is recommended:  If you are 46-71 years old, get at least 1,000 mg of calcium and at least 600 mg of vitamin D per day.  If you are older than age 55 but younger than age 65, get at least 1,200 mg of calcium and at least 600 mg of vitamin D per day.  If you are older than age 54, get at least 1,200 mg of calcium and at least 800 mg of vitamin D per day.  Smoking and excessive alcohol intake increase the risk of osteoporosis. Eat foods that are rich in calcium and vitamin D, and do weight-bearing exercises several times each week as directed by your health care provider. What should I know about how menopause affects my mental health? Depression may occur at any age, but it is more common as you become older. Common symptoms of depression  include:  Low or sad mood.  Changes in sleep patterns.  Changes in appetite or eating patterns.  Feeling an overall lack of motivation or enjoyment of activities that you previously enjoyed.  Frequent crying spells.  Talk with your health care provider if you think that you are experiencing depression. What should I know about immunizations? It is important that you get and maintain your immunizations. These include:  Tetanus, diphtheria, and pertussis (Tdap) booster vaccine.  Influenza every year before the flu season begins.  Pneumonia vaccine.  Shingles vaccine.  Your health care provider may also recommend other immunizations. This information is not intended to replace advice given to you by your health care provider. Make sure you discuss any questions you have with your health care provider. Document Released: 12/17/2005  Document Revised: 05/14/2016 Document Reviewed: 07/29/2015 Elsevier Interactive Patient Education  2018 [employer]  

## 2017-07-28 ENCOUNTER — Encounter: Payer: Self-pay | Admitting: Internal Medicine

## 2018-02-20 ENCOUNTER — Other Ambulatory Visit: Payer: Self-pay | Admitting: Obstetrics and Gynecology

## 2018-02-20 DIAGNOSIS — Z1231 Encounter for screening mammogram for malignant neoplasm of breast: Secondary | ICD-10-CM

## 2018-04-05 ENCOUNTER — Other Ambulatory Visit: Payer: Self-pay | Admitting: Internal Medicine

## 2018-04-06 ENCOUNTER — Telehealth: Payer: Self-pay | Admitting: Internal Medicine

## 2018-04-06 ENCOUNTER — Ambulatory Visit
Admission: RE | Admit: 2018-04-06 | Discharge: 2018-04-06 | Disposition: A | Discharge status: Home / Self Care | Payer: Managed Care, Other (non HMO) | Source: Ambulatory Visit | Attending: Obstetrics and Gynecology | Admitting: Obstetrics and Gynecology

## 2018-04-06 DIAGNOSIS — Z1231 Encounter for screening mammogram for malignant neoplasm of breast: Secondary | ICD-10-CM

## 2018-04-06 NOTE — Telephone Encounter (Signed)
Copied from [place] [id number]. Topic: Quick Communication - Rx Refill/Question >> Apr 06, 2018  8:45 AM Robina Ade, Helene Kelp D wrote: Medication: meloxicam (MOBIC) 15 MG tablet  Has the patient contacted their pharmacy? No (Agent: If no, request that the patient contact the pharmacy for the refill.) (Agent: If yes, when and what did the pharmacy advise?)  Preferred Pharmacy (with phone number or street name): [id number] - [place], [place]: Please be advised that RX refills may take up to 3 business days. We ask that you follow-up with your pharmacy.

## 2018-05-16 ENCOUNTER — Encounter: Payer: Managed Care, Other (non HMO) | Admitting: Internal Medicine

## 2018-12-29 IMAGING — MG DIGITAL SCREENING BILATERAL MAMMOGRAM WITH TOMO AND CAD
8 series · 9 of 24 positions shown · non-contrast
Comparison: Previous exam(s).

CLINICAL DATA: Screening.

EXAM:
DIGITAL SCREENING BILATERAL MAMMOGRAM WITH TOMO AND CAD

[R MLO synth-2D]
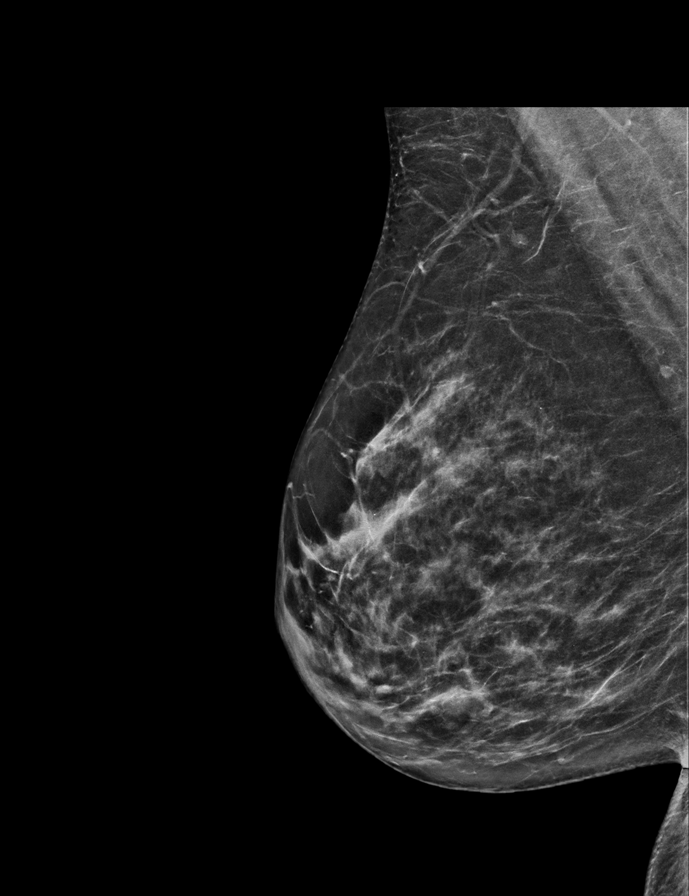

[L CC synth-2D]
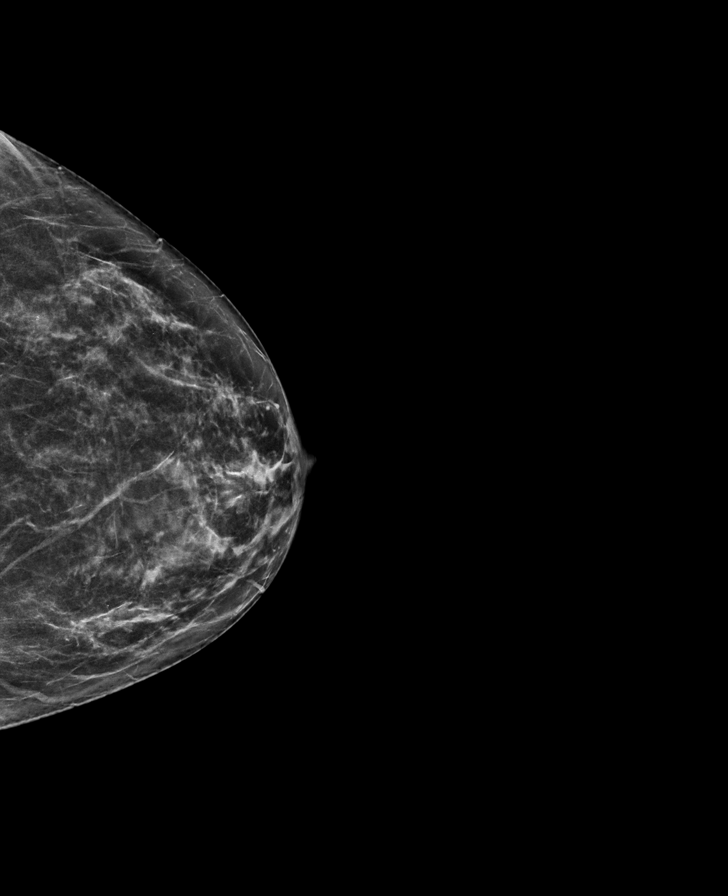

[R CC synth-2D]
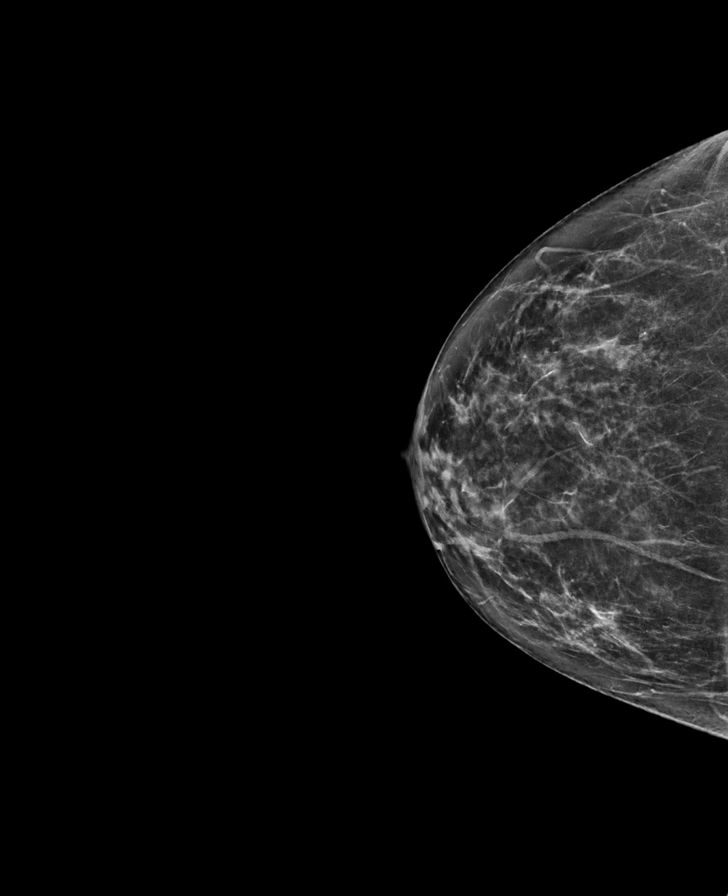

[L MLO synth-2D]
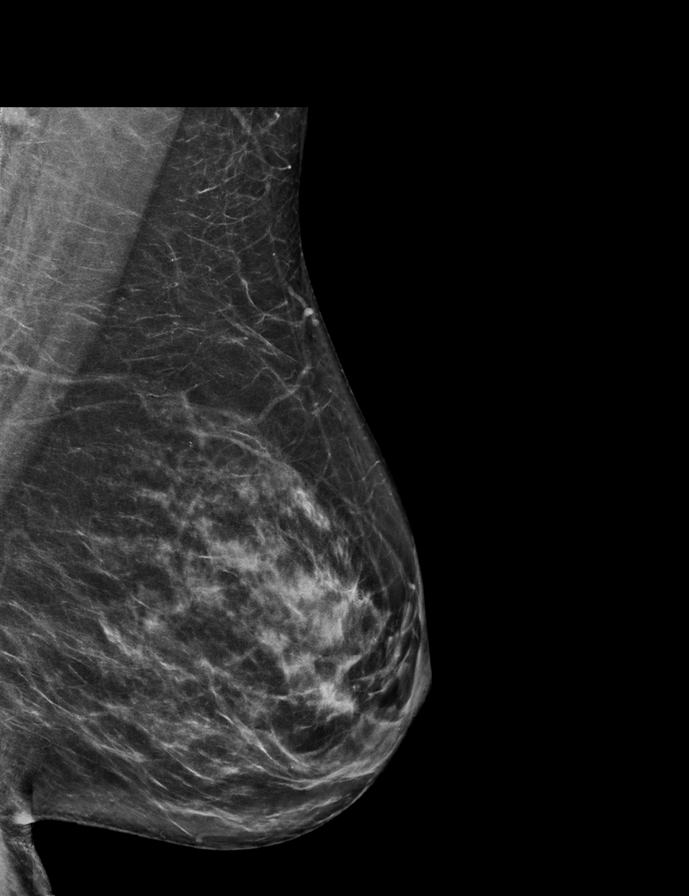

[L CC tomo · 2 of 62 frames shown]
[frame 21/62]
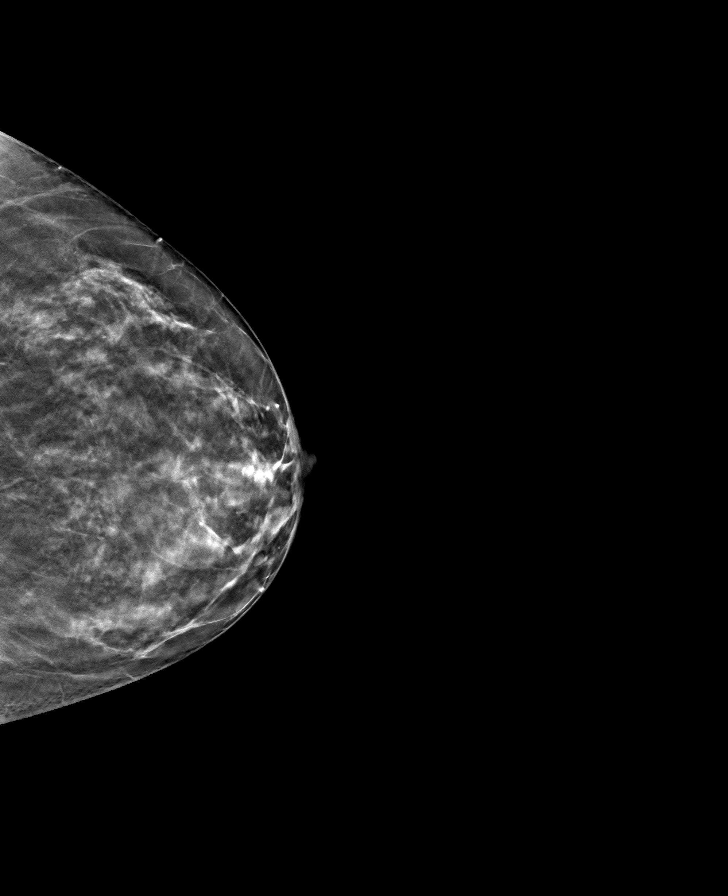
[frame 31/62]
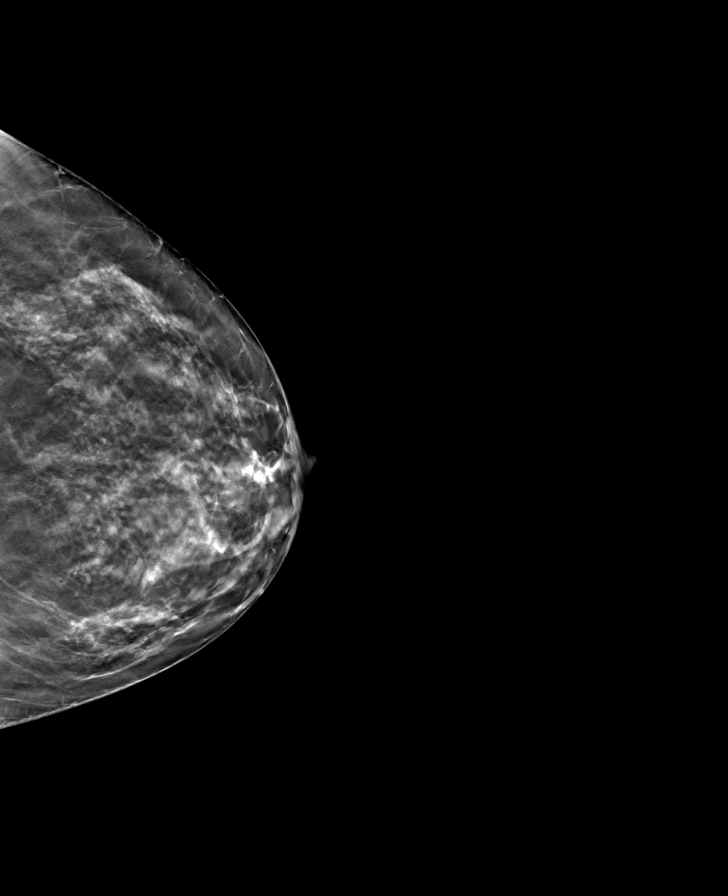

[L MLO tomo · tomo slice 36/71.0]
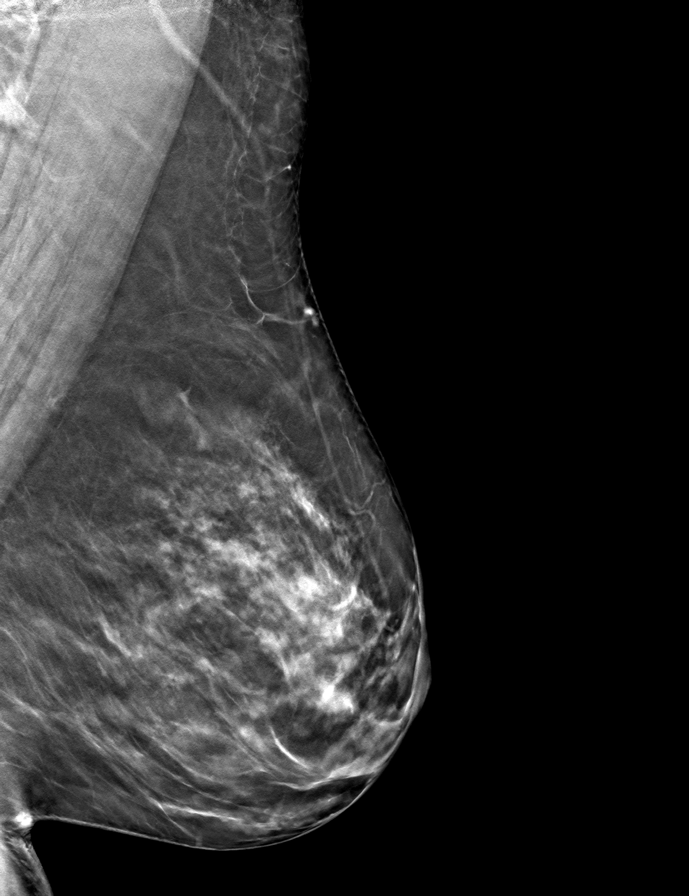

[R MLO tomo · tomo slice 33/65.0]
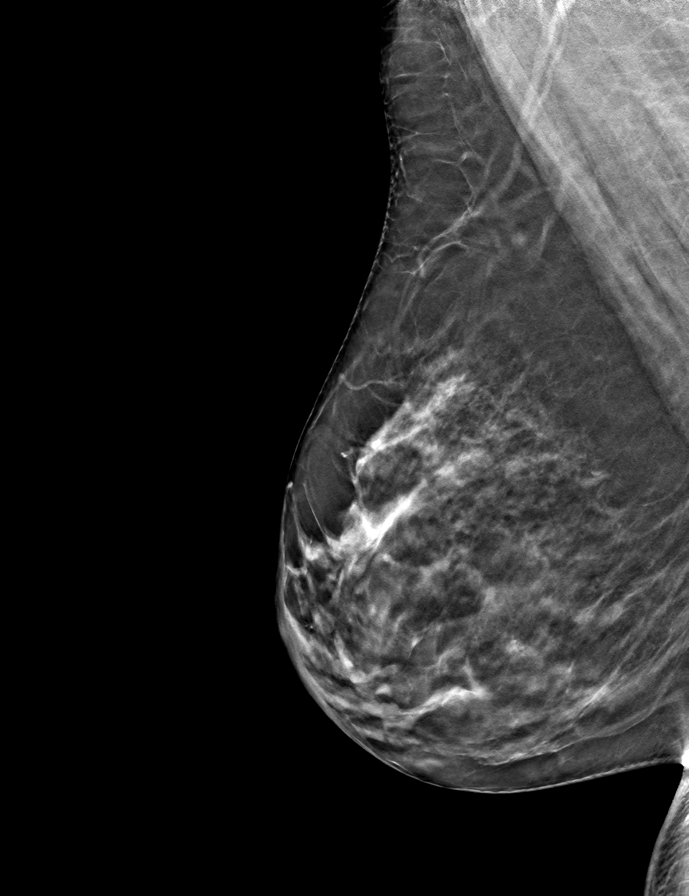

[R CC tomo · tomo slice 33/64.0]
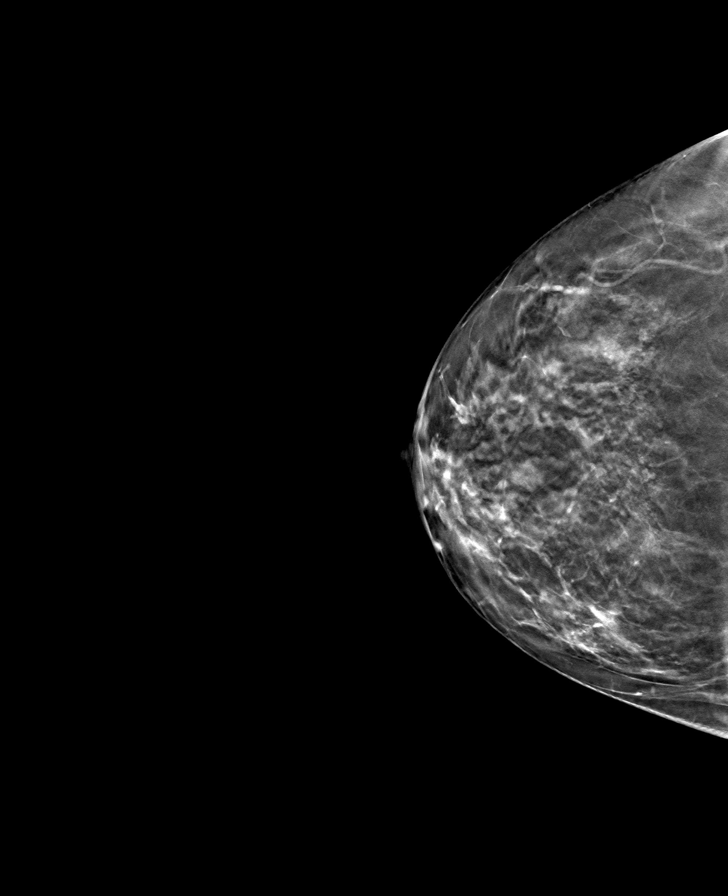

[9 of 24 positions shown; findings below may reference images not displayed]

ACR Breast Density Category b: There are scattered areas of
fibroglandular density.
FINDINGS: There are no findings suspicious for malignancy. Images were
processed with CAD.
IMPRESSION: No mammographic evidence of malignancy. A result letter of this
screening mammogram will be mailed directly to the patient.

RECOMMENDATION:
Screening mammogram in one year. (Code:[id number])

BI-RADS CATEGORY  1: Negative.

## 2019-04-11 ENCOUNTER — Other Ambulatory Visit: Payer: Self-pay | Admitting: Obstetrics and Gynecology

## 2019-04-11 DIAGNOSIS — Z1231 Encounter for screening mammogram for malignant neoplasm of breast: Secondary | ICD-10-CM

## 2019-04-25 ENCOUNTER — Encounter: Payer: Self-pay | Admitting: Gastroenterology

## 2019-05-02 ENCOUNTER — Telehealth: Payer: Self-pay | Admitting: *Deleted

## 2019-05-02 NOTE — Telephone Encounter (Signed)
I have contacted patient to advise that she is due for recall colonoscopy based on her history of adenomatous colon polyps per Dr Woodward Ku recommendations. Patient indicates that she is currently living in [place] and is scheduled to have a colonoscopy on May 21, 2019 with a GI there. She indicates that she and her husband plan to move back to [place] in 2 years or so.

## 2020-04-25 ENCOUNTER — Other Ambulatory Visit: Payer: Self-pay | Admitting: Obstetrics and Gynecology

## 2020-04-25 DIAGNOSIS — R928 Other abnormal and inconclusive findings on diagnostic imaging of breast: Secondary | ICD-10-CM

## 2021-12-03 ENCOUNTER — Emergency Department (HOSPITAL_BASED_OUTPATIENT_CLINIC_OR_DEPARTMENT_OTHER): Payer: Managed Care, Other (non HMO)

## 2021-12-03 ENCOUNTER — Other Ambulatory Visit: Payer: Self-pay

## 2021-12-03 ENCOUNTER — Emergency Department (HOSPITAL_BASED_OUTPATIENT_CLINIC_OR_DEPARTMENT_OTHER)
Admission: EM | Admit: 2021-12-03 | Discharge: 2021-12-03 | Disposition: A | Discharge status: Home / Self Care | Payer: Managed Care, Other (non HMO) | Attending: Emergency Medicine | Admitting: Emergency Medicine

## 2021-12-03 DIAGNOSIS — R109 Unspecified abdominal pain: Secondary | ICD-10-CM | POA: Diagnosis present

## 2021-12-03 DIAGNOSIS — N201 Calculus of ureter: Secondary | ICD-10-CM | POA: Diagnosis not present

## 2021-12-03 LAB — CBC WITH DIFFERENTIAL/PLATELET
Abs Immature Granulocytes: 0.04 10*3/uL (ref 0.00–0.07)
Basophils Absolute: 0 10*3/uL (ref 0.0–0.1)
Basophils Relative: 0 %
Eosinophils Absolute: 0 10*3/uL (ref 0.0–0.5)
Eosinophils Relative: 0 %
HCT: 42.7 % (ref 36.0–46.0)
Hemoglobin: 14.3 g/dL (ref 12.0–15.0)
Immature Granulocytes: 0 %
Lymphocytes Relative: 12 %
Lymphs Abs: 1.4 10*3/uL (ref 0.7–4.0)
MCH: 30.4 pg (ref 26.0–34.0)
MCHC: 33.5 g/dL (ref 30.0–36.0)
MCV: 90.9 fL (ref 80.0–100.0)
Monocytes Absolute: 1 10*3/uL (ref 0.1–1.0)
Monocytes Relative: 8 %
Neutro Abs: 9.8 10*3/uL — ABNORMAL HIGH (ref 1.7–7.7)
Neutrophils Relative %: 80 %
Platelets: 284 10*3/uL (ref 150–400)
RBC: 4.7 MIL/uL (ref 3.87–5.11)
RDW: 13.1 % (ref 11.5–15.5)
WBC: 12.3 10*3/uL — ABNORMAL HIGH (ref 4.0–10.5)
nRBC: 0 % (ref 0.0–0.2)

## 2021-12-03 LAB — URINALYSIS, ROUTINE W REFLEX MICROSCOPIC
Bilirubin Urine: NEGATIVE
Glucose, UA: NEGATIVE mg/dL
Leukocytes,Ua: NEGATIVE
Nitrite: NEGATIVE
Protein, ur: NEGATIVE mg/dL
Specific Gravity, Urine: 1.01 (ref 1.005–1.030)
pH: 5 (ref 5.0–8.0)

## 2021-12-03 LAB — COMPREHENSIVE METABOLIC PANEL
ALT: 10 U/L (ref 0–44)
AST: 21 U/L (ref 15–41)
Albumin: 4.7 g/dL (ref 3.5–5.0)
Alkaline Phosphatase: 72 U/L (ref 38–126)
Anion gap: 16 — ABNORMAL HIGH (ref 5–15)
BUN: 22 mg/dL — ABNORMAL HIGH (ref 6–20)
CO2: 21 mmol/L — ABNORMAL LOW (ref 22–32)
Calcium: 10.1 mg/dL (ref 8.9–10.3)
Chloride: 103 mmol/L (ref 98–111)
Creatinine, Ser: 1.27 mg/dL — ABNORMAL HIGH (ref 0.44–1.00)
GFR, Estimated: 50 mL/min — ABNORMAL LOW (ref >60)
Glucose, Bld: 123 mg/dL — ABNORMAL HIGH (ref 70–99)
Potassium: 3.9 mmol/L (ref 3.5–5.1)
Sodium: 140 mmol/L (ref 135–145)
Total Bilirubin: 0.5 mg/dL (ref 0.3–1.2)
Total Protein: 7.5 g/dL (ref 6.5–8.1)

## 2021-12-03 LAB — LIPASE, BLOOD: Lipase: <10 U/L — ABNORMAL LOW (ref 11–51)

## 2021-12-03 MED ORDER — OXYCODONE-ACETAMINOPHEN 5-325 MG PO TABS: 1.0000 | ORAL_TABLET | Freq: Four times a day (QID) | ORAL | 0 refills | Status: AC | PRN

## 2021-12-03 MED ORDER — TAMSULOSIN HCL 0.4 MG PO CAPS: 0.4000 mg | ORAL_CAPSULE | Freq: Every day | ORAL | 0 refills | Status: AC

## 2021-12-03 MED ORDER — ONDANSETRON HCL 4 MG/2ML IJ SOLN
4.0000 mg | Freq: Once | INTRAMUSCULAR | Status: AC
Start: 2021-12-03 — End: 2021-12-03
  Administered 2021-12-03: 4 mg via INTRAVENOUS
  Filled 2021-12-03: qty 2

## 2021-12-03 MED ORDER — SENNOSIDES-DOCUSATE SODIUM 8.6-50 MG PO TABS: 1.0000 | ORAL_TABLET | Freq: Every evening | ORAL | 0 refills | Status: AC | PRN

## 2021-12-03 MED ORDER — ONDANSETRON 4 MG PO TBDP: 4.0000 mg | ORAL_TABLET | Freq: Three times a day (TID) | ORAL | 0 refills | Status: AC | PRN

## 2021-12-03 MED ORDER — KETOROLAC TROMETHAMINE 30 MG/ML IJ SOLN
30.0000 mg | Freq: Once | INTRAMUSCULAR | Status: AC
  Administered 2021-12-03: 30 mg via INTRAVENOUS
  Filled 2021-12-03: qty 1

## 2021-12-03 NOTE — ED Provider Notes (Signed)
Emergency Department Provider Note   I have reviewed the triage vital signs and the nursing notes.   HISTORY  Chief Complaint Flank Pain   HPI Holly Mckinney is a 56 y.o. female with recent history of ureteral stone presents to the emergency department with acute onset left flank pain which is severe.  She has some urine hesitancy.  No fevers or chills.  She states 2 weeks ago she passed a kidney stone at home.  She did not seek care at that time but eventually urinated out a small stone in the toilet which she retrieved.  That was her first kidney stone and when pain returned today she felt this was similar.     Past Medical History:  Diagnosis Date   Family history of anesthesia complication    grandfather   PONV (postoperative nausea and vomiting)     Review of Systems  Constitutional: No fever/chills Eyes: No visual changes. ENT: No sore throat. Cardiovascular: Denies chest pain. Respiratory: Denies shortness of breath. Gastrointestinal: Positive left flank/abdominal pain. Positive nausea, no vomiting.  No diarrhea.  No constipation. Genitourinary: Negative for dysuria. Musculoskeletal: Positive for back pain. Skin: Negative for rash. Neurological: Negative for headaches, focal weakness or numbness.   ____________________________________________   PHYSICAL EXAM:  VITAL SIGNS: ED Triage Vitals  Enc Vitals Group     BP 12/03/21 0928 (!) 131/107     Pulse Rate 12/03/21 0928 65     Resp 12/03/21 0928 20     Temp 12/03/21 0928 98.2 F (36.8 C)     Temp Source 12/03/21 0928 Oral     SpO2 12/03/21 0928 100 %     Weight 12/03/21 0926 160 lb (72.6 kg)     Height 12/03/21 0926 5\' 3"  (1.6 m)   Constitutional: Alert and oriented. Patient writhing and appears uncomfortable.  Eyes: Conjunctivae are normal.  Head: Atraumatic. Nose: No congestion/rhinnorhea. Mouth/Throat: Mucous membranes are moist.  Neck: No stridor.  Cardiovascular: Normal rate, regular  rhythm. Good peripheral circulation. Grossly normal heart sounds.   Respiratory: Normal respiratory effort.  No retractions. Lungs CTAB. Gastrointestinal: Soft and nontender. No distention.  Musculoskeletal: No gross deformities of extremities. Neurologic:  Normal speech and language.  Skin:  Skin is warm, dry and intact. No rash noted.   ____________________________________________   LABS (all labs ordered are listed, but only abnormal results are displayed)  Labs Reviewed  COMPREHENSIVE METABOLIC PANEL - Abnormal; Notable for the following components:      Result Value   CO2 21 (*)    Glucose, Bld 123 (*)    BUN 22 (*)    Creatinine, Ser 1.27 (*)    GFR, Estimated 50 (*)    Anion gap 16 (*)    All other components within normal limits  LIPASE, BLOOD - Abnormal; Notable for the following components:   Lipase <10 (*)    All other components within normal limits  CBC WITH DIFFERENTIAL/PLATELET - Abnormal; Notable for the following components:   WBC 12.3 (*)    Neutro Abs 9.8 (*)    All other components within normal limits  URINALYSIS, ROUTINE W REFLEX MICROSCOPIC - Abnormal; Notable for the following components:   Hgb urine dipstick TRACE (*)    Ketones, ur TRACE (*)    All other components within normal limits  URINE CULTURE   ____________________________________________  RADIOLOGY  CT Renal Stone Study  Result Date: 12/03/2021 CLINICAL DATA:  Flank pain EXAM: CT ABDOMEN AND PELVIS WITHOUT CONTRAST  TECHNIQUE: Multidetector CT imaging of the abdomen and pelvis was performed following the standard protocol without IV contrast. RADIATION DOSE REDUCTION: This exam was performed according to the departmental dose-optimization program which includes automated exposure control, adjustment of the mA and/or kV according to patient size and/or use of iterative reconstruction technique. COMPARISON:  None. FINDINGS: Lower chest: No acute abnormality. Hepatobiliary: No focal liver  abnormality is seen. No gallstones, gallbladder wall thickening, or biliary dilatation. Pancreas: Unremarkable. No pancreatic ductal dilatation or surrounding inflammatory changes. Spleen: Normal in size without focal abnormality. Adrenals/Urinary Tract: Adrenal glands appear normal. 2 mm calculus at the left UVJ causing mild left hydroureteronephrosis and left perinephric fat stranding. A few tiny renal calculi identified bilaterally measuring up to 2 mm in the lower pole on the right and mid pole on the left. Urinary bladder appears otherwise normal. Stomach/Bowel: Stomach is within normal limits. Appendix appears normal. No evidence of bowel wall thickening, distention, or inflammatory changes. Large amount of retained fecal material throughout the colon. Vascular/Lymphatic: No significant vascular findings are present. No enlarged abdominal or pelvic lymph nodes. Reproductive: Status post hysterectomy. No adnexal masses. Other: No ascites. 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 No suspicious bony lesions identified. IMPRESSION: 1. 2 mm obstructing calculus at the left UVJ. Mild left hydroureteronephrosis and perinephric fat stranding. 2. Small bilateral renal calculi. 3. Chronic and degenerative changes at L5-S1 as described. Electronically Signed   By: Ofilia Neas M.D.   On: 12/03/2021 10:52    ____________________________________________   PROCEDURES  Procedure(s) performed:   Procedures  None  ____________________________________________   INITIAL IMPRESSION / ASSESSMENT AND PLAN / ED COURSE  Pertinent labs & imaging results that were available during my care of the patient were reviewed by me and considered in my medical decision making (see chart for details).   This patient is Presenting for Evaluation of abdominal pain, which does require  a range of treatment options, and is a complaint that involves a high risk of morbidity and mortality.  The Differential Diagnoses includes but is not exclusive to acute cholecystitis, intrathoracic causes for epigastric abdominal pain, gastritis, duodenitis, pancreatitis, small bowel or large bowel obstruction, abdominal aortic aneurysm, hernia, gastritis, ureteral stone etc.   Critical Interventions- IV pain medication and Zofran   Medications  ketorolac (TORADOL) 30 MG/ML injection 30 mg (30 mg Intravenous Given 12/03/21 0957)  ondansetron (ZOFRAN) injection 4 mg (4 mg Intravenous Given 12/03/21 0955)    Reassessment after intervention: Patient looking much more comfortable on reassessment.    I did obtain Additional Historical Information from husband at bedside who confirms symptoms and timeline.  I decided to review pertinent External Data, and in summary no prior ED visits or Urology notes for stone.   Clinical Laboratory Tests Ordered, included mild leukocytosis, likely reactive secondary to pain response.  Creatinine of 1.27.  No evidence of infection on UA.  She does have trace hemoglobin.  Lipase is less than 10.   Radiologic Tests Ordered, included CT renal. I independently interpreted the images and agree with radiology interpretation.   Cardiac Monitor Tracing which shows NSR.   Social Determinants of Health Risk non-smoker  Reevaluation with update and discussion with patient and husband.  Is feeling much better.  She has a 2 mm stone at the left UVJ which correlates with the side of her symptoms.  Plan for expectant management at  home and urology referral as this is her second stone in 2 weeks.   Medical Decision Making: Summary:  Patient arrives with acute on left flank pain which is severe.  Considered vascular and surgical etiologies to explain symptoms but seems consistent with prior kidney stone pain.  CT renal obtained showing stone at the left UVJ.  Patient  responding well to treatment here.  No evidence of infection on UA.  Plan for continued pain control, Flomax, urology follow-up as an outpatient.  Disposition: discharge  ____________________________________________  FINAL CLINICAL IMPRESSION(S) / ED DIAGNOSES  Final diagnoses:  Left ureteral stone     NEW OUTPATIENT MEDICATIONS STARTED DURING THIS VISIT:  Discharge Medication List as of 12/03/2021  2:03 PM     START taking these medications   Details  ondansetron (ZOFRAN-ODT) 4 MG disintegrating tablet Take 1 tablet (4 mg total) by mouth every 8 (eight) hours as needed., Starting Thu 12/03/2021, Normal    oxyCODONE-acetaminophen (PERCOCET/ROXICET) 5-325 MG tablet Take 1 tablet by mouth every 6 (six) hours as needed for severe pain., Starting Thu 12/03/2021, Normal    senna-docusate (SENOKOT-S) 8.6-50 MG tablet Take 1 tablet by mouth at bedtime as needed for mild constipation or moderate constipation., Starting Thu 12/03/2021, Normal    tamsulosin (FLOMAX) 0.4 MG CAPS capsule Take 1 capsule (0.4 mg total) by mouth daily., Starting Thu 12/03/2021, Normal        Note:  This document was prepared using Dragon voice recognition software and may include unintentional dictation errors.  Nanda Quinton, MD, [facility]    Thimothy Barretta, [facility], MD 12/04/21 [phone]

## 2021-12-03 NOTE — Discharge Instructions (Signed)
You have been seen in the Emergency Department (ED) today for pain that we believe based on your workup, is caused by kidney stones.  As we have discussed, please drink plenty of fluids.  Please make a follow up appointment with the physician(s) listed elsewhere in this documentation.  You may take pain medication as needed but ONLY as prescribed.  Please also take your prescribed Flomax daily.  We also recommend that you take over-the-counter ibuprofen regularly according to label instructions over the next 5 days.  Take it with meals to minimize stomach discomfort.  Please see your doctor as soon as possible as stones may take 1-3 weeks to pass and you may require additional care or medications.  Do not drink alcohol, drive or participate in any other potentially dangerous activities while taking opiate pain medication as it may make you sleepy. Do not take this medication with any other sedating medications, either prescription or over-the-counter. If you were prescribed Percocet or Vicodin, do not take these with acetaminophen (Tylenol) as it is already contained within these medications.   Take Percocet as needed for severe pain.  This medication is an opiate (or narcotic) pain medication and can be habit forming.  Use it as little as possible to achieve adequate pain control.  Do not use or use it with extreme caution if you have a history of opiate abuse or dependence.  This medication is intended for your use only - do not give any to anyone else and keep it in a secure place where nobody else, especially children, have access to it.  It will also cause or worsen constipation, so you may want to consider taking an over-the-counter stool softener while you are taking this medication.  Return to the Emergency Department (ED) or call your doctor if you have any worsening pain, fever, painful urination, are unable to urinate, or develop other symptoms that concern you.

## 2021-12-03 NOTE — ED Notes (Signed)
RT Note: V/S updated, RN made aware of BP reading.

## 2021-12-03 NOTE — ED Triage Notes (Addendum)
Pt to ED c/o flank pain,  reports hx of Kidney Stone, feels like she did when had kidney stone, symptoms started last night around 2100, No relief with OTC extra strength tylenol, last dose last night. No blood in the urine, No difficulties with urination. Reports nausea and vomiting.

## 2021-12-04 LAB — URINE CULTURE: Culture: <10000 — AB

## 2022-08-27 IMAGING — CT CT RENAL STONE PROTOCOL
2 of 4 series · 16 of 46 positions shown, 18 images · non-contrast
Comparison: None.

CLINICAL DATA: Flank pain



[Series 2: stone full · axial · 0.80mm/px · z∈[-429,-14]mm · 13 of 91 slices shown, 15 images]
[im 4/91  soft-tissue]
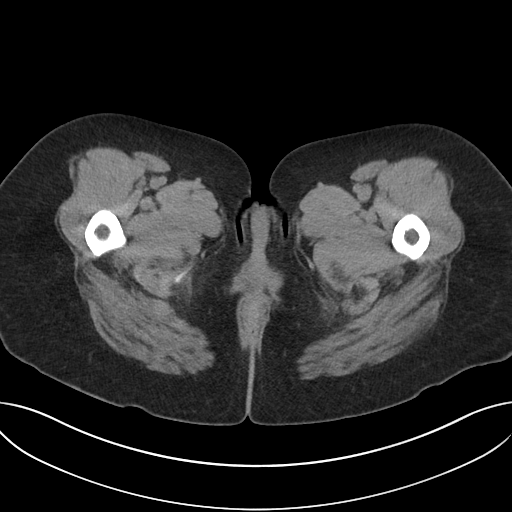
[im 4/91  bone]
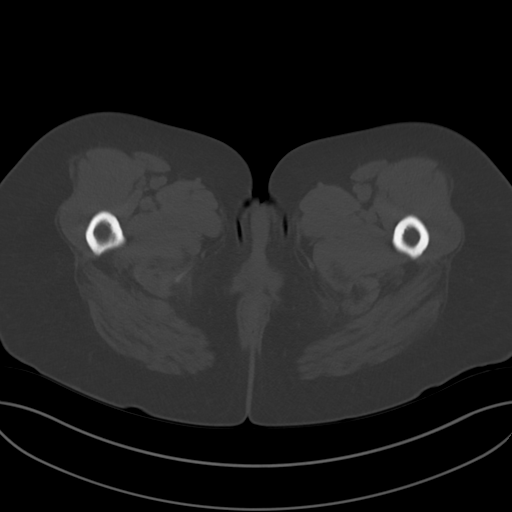
[im 11/91  soft-tissue]
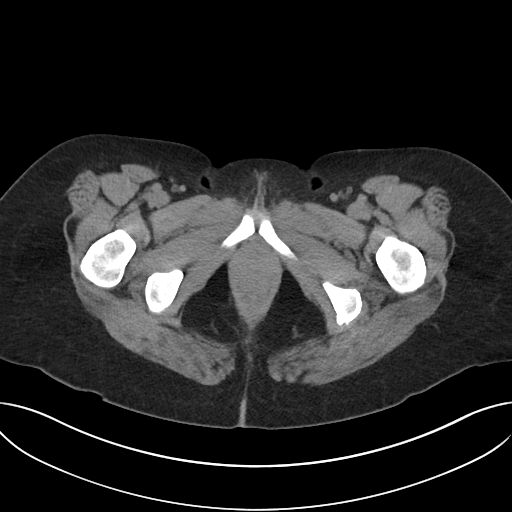
[im 19/91  soft-tissue]
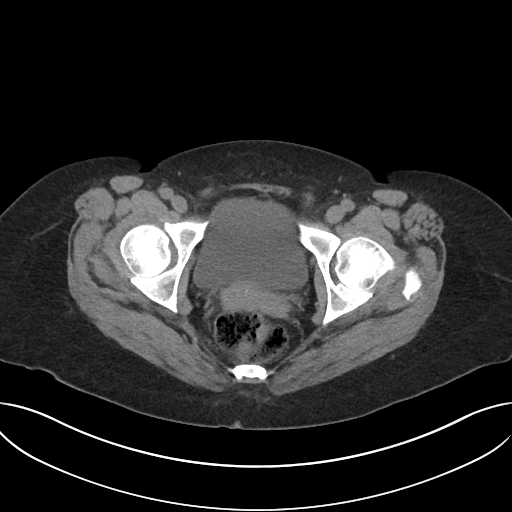
[im 26/91  soft-tissue]
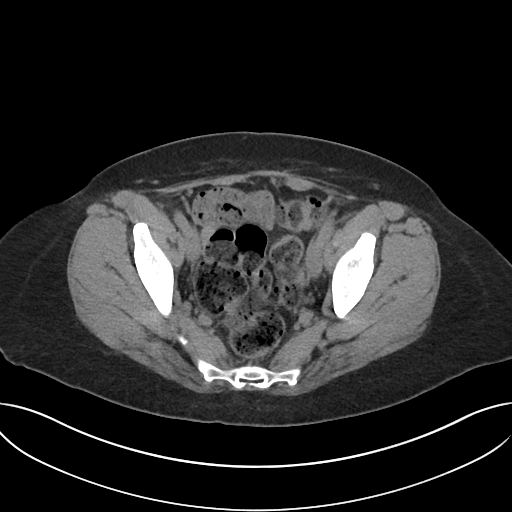
[im 33/91  soft-tissue]
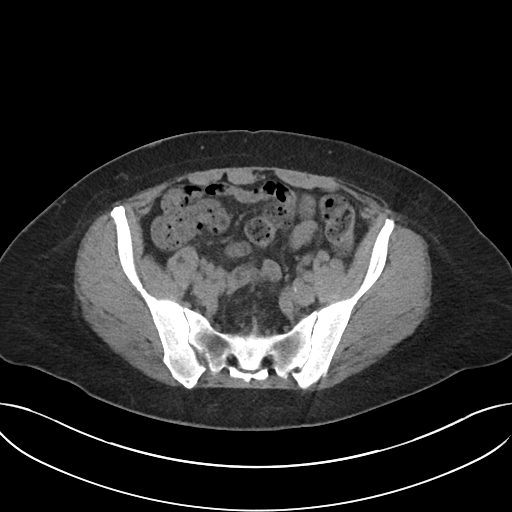
[im 40/91  soft-tissue]
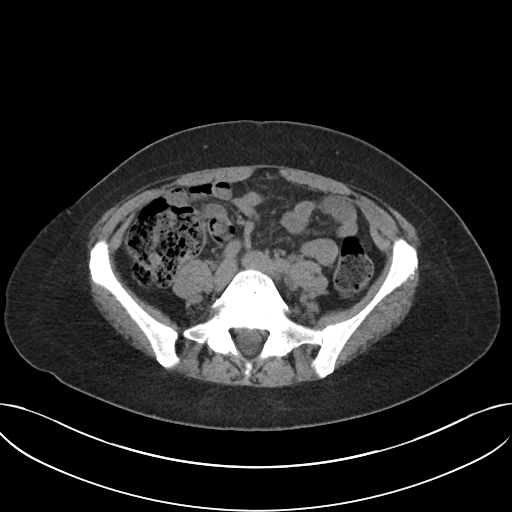
[im 47/91  soft-tissue]
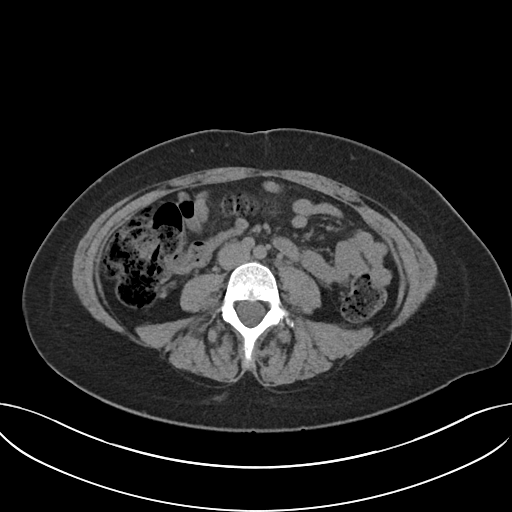
[im 51/91  soft-tissue]
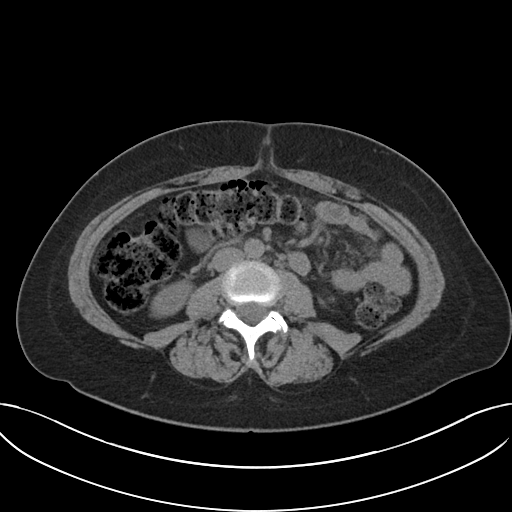
[im 58/91  soft-tissue]
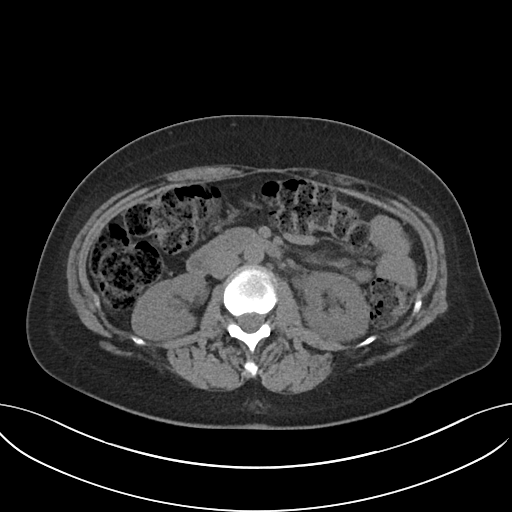
[im 58/91  bone]
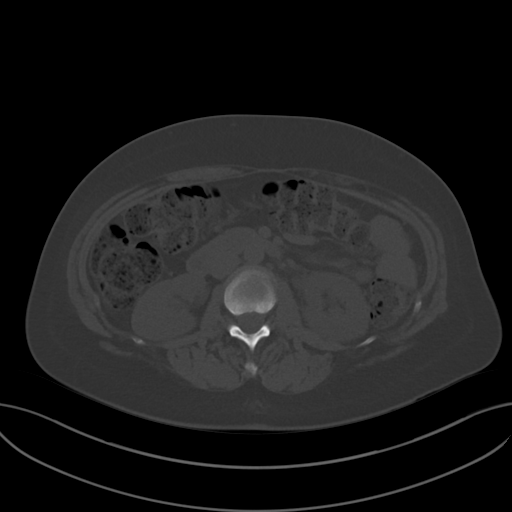
[im 65/91  soft-tissue]
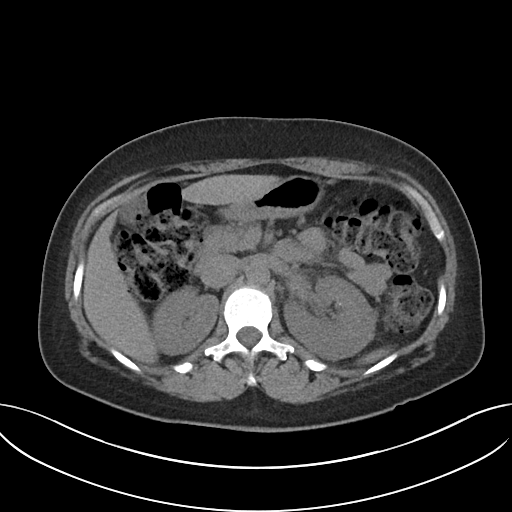
[im 73/91  soft-tissue]
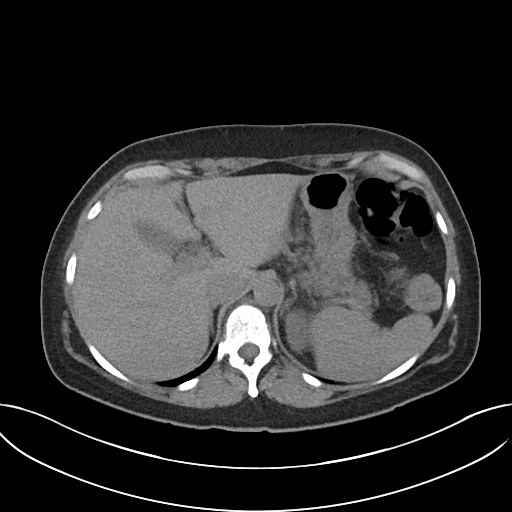
[im 80/91  soft-tissue]
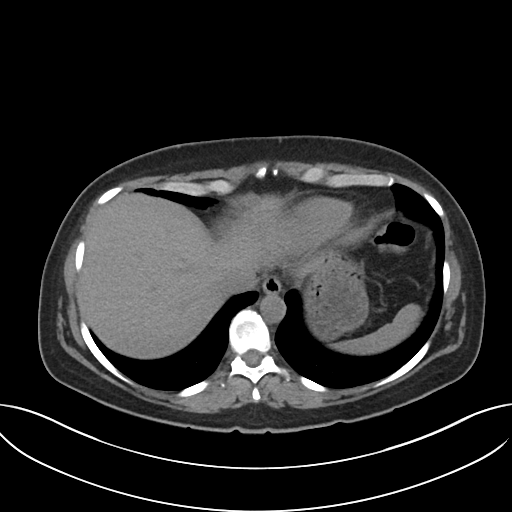
[im 87/91  soft-tissue]
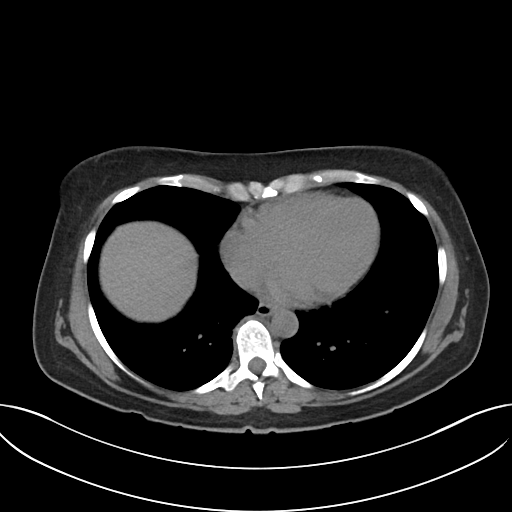

[Series 5: coronal · coronal · 0.82mm/px · 3 of 89 slices shown]
[im 30/89  soft-tissue]
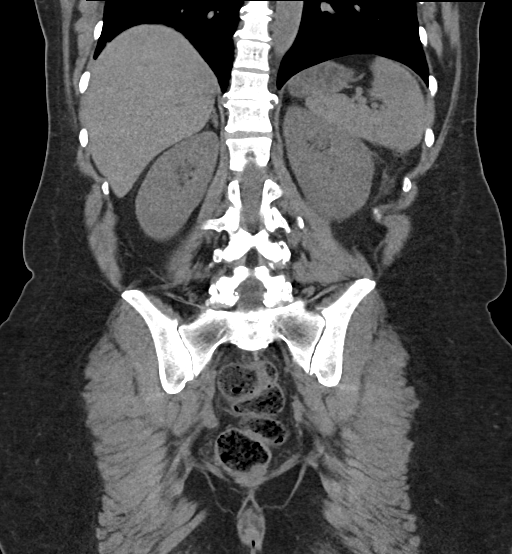
[im 40/89  soft-tissue]
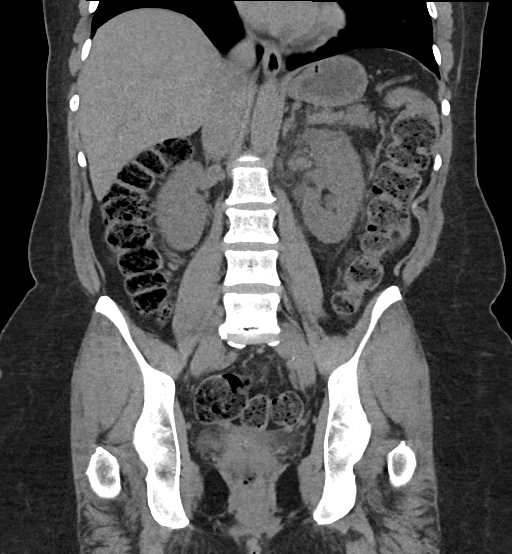
[im 49/89  soft-tissue]
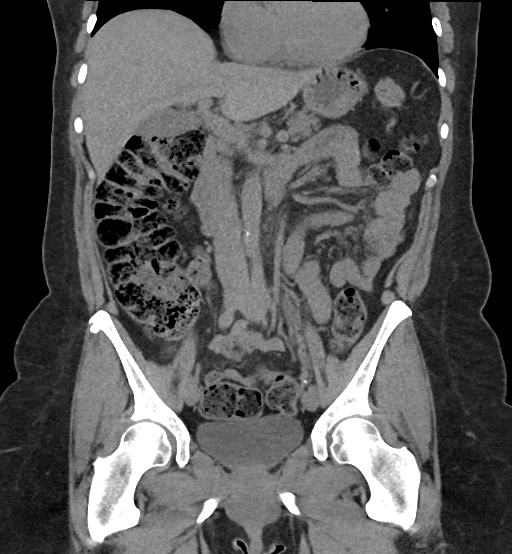

[16 of 46 positions shown; findings below may reference images not displayed]

FINDINGS: Lower chest: No acute abnormality.

Hepatobiliary: No focal liver abnormality is seen. No gallstones,
gallbladder wall thickening, or biliary dilatation.

Pancreas: Unremarkable. No pancreatic ductal dilatation or
surrounding inflammatory changes.

Spleen: Normal in size without focal abnormality.

Adrenals/Urinary Tract: Adrenal glands appear normal. 2 mm calculus
at the left UVJ causing mild left hydroureteronephrosis and left
perinephric fat stranding. A few tiny renal calculi identified
bilaterally measuring up to 2 mm in the lower pole on the right and
mid pole on the left. Urinary bladder appears otherwise normal.

Stomach/Bowel: Stomach is within normal limits. Appendix appears
normal. No evidence of bowel wall thickening, distention, or
inflammatory changes. Large amount of retained fecal material
throughout the colon.

Vascular/Lymphatic: No significant vascular findings are present. No
enlarged abdominal or pelvic lymph nodes.

Reproductive: Status post hysterectomy. No adnexal masses.

Other: No ascites.

Musculoskeletal: Bilateral spondylolysis of L5 with grade 1
anterolisthesis of L5 on S1 and severe degenerative changes at L5-S1
including severe bilateral neural foraminal narrowing. No suspicious
bony lesions identified.
IMPRESSION: 1. 2 mm obstructing calculus at the left UVJ. Mild left
hydroureteronephrosis and perinephric fat stranding.
2. Small bilateral renal calculi.
3. Chronic and degenerative changes at L5-S1 as described.

## 2023-05-15 ENCOUNTER — Encounter (HOSPITAL_BASED_OUTPATIENT_CLINIC_OR_DEPARTMENT_OTHER): Payer: Self-pay

## 2023-05-15 ENCOUNTER — Emergency Department (HOSPITAL_BASED_OUTPATIENT_CLINIC_OR_DEPARTMENT_OTHER)
Admission: EM | Admit: 2023-05-15 | Discharge: 2023-05-15 | Disposition: A | Discharge status: Home / Self Care | Payer: Managed Care, Other (non HMO) | Attending: Emergency Medicine | Admitting: Emergency Medicine

## 2023-05-15 ENCOUNTER — Emergency Department (HOSPITAL_BASED_OUTPATIENT_CLINIC_OR_DEPARTMENT_OTHER): Payer: Managed Care, Other (non HMO)

## 2023-05-15 DIAGNOSIS — N201 Calculus of ureter: Secondary | ICD-10-CM | POA: Diagnosis not present

## 2023-05-15 DIAGNOSIS — R109 Unspecified abdominal pain: Secondary | ICD-10-CM | POA: Diagnosis present

## 2023-05-15 LAB — URINALYSIS, ROUTINE W REFLEX MICROSCOPIC
Bacteria, UA: NONE SEEN
Bilirubin Urine: NEGATIVE
Glucose, UA: NEGATIVE mg/dL
Leukocytes,Ua: NEGATIVE
Nitrite: NEGATIVE
RBC / HPF: >50 RBC/hpf (ref 0–5)
Specific Gravity, Urine: 1.028 (ref 1.005–1.030)
pH: 8 (ref 5.0–8.0)

## 2023-05-15 LAB — BASIC METABOLIC PANEL
Anion gap: 12 (ref 5–15)
BUN: 22 mg/dL — ABNORMAL HIGH (ref 6–20)
CO2: 25 mmol/L (ref 22–32)
Calcium: 10.3 mg/dL (ref 8.9–10.3)
Chloride: 103 mmol/L (ref 98–111)
Creatinine, Ser: 0.8 mg/dL (ref 0.44–1.00)
GFR, Estimated: >60 mL/min (ref >60)
Glucose, Bld: 131 mg/dL — ABNORMAL HIGH (ref 70–99)
Potassium: 3.7 mmol/L (ref 3.5–5.1)
Sodium: 140 mmol/L (ref 135–145)

## 2023-05-15 LAB — CBC
HCT: 41.8 % (ref 36.0–46.0)
Hemoglobin: 14.2 g/dL (ref 12.0–15.0)
MCH: 31 pg (ref 26.0–34.0)
MCHC: 34 g/dL (ref 30.0–36.0)
MCV: 91.3 fL (ref 80.0–100.0)
Platelets: 265 10*3/uL (ref 150–400)
RBC: 4.58 MIL/uL (ref 3.87–5.11)
RDW: 13.2 % (ref 11.5–15.5)
WBC: 11.9 10*3/uL — ABNORMAL HIGH (ref 4.0–10.5)
nRBC: 0 % (ref 0.0–0.2)

## 2023-05-15 MED ORDER — KETOROLAC TROMETHAMINE 10 MG PO TABS: 10.0000 mg | ORAL_TABLET | Freq: Four times a day (QID) | ORAL | 0 refills | Status: AC | PRN

## 2023-05-15 MED ORDER — TAMSULOSIN HCL 0.4 MG PO CAPS: 0.4000 mg | ORAL_CAPSULE | Freq: Two times a day (BID) | ORAL | 0 refills | Status: AC

## 2023-05-15 MED ORDER — ONDANSETRON HCL 4 MG PO TABS: 4.0000 mg | ORAL_TABLET | Freq: Three times a day (TID) | ORAL | 0 refills | Status: AC | PRN

## 2023-05-15 MED ORDER — OXYCODONE HCL 5 MG PO TABS
2.5000 mg | ORAL_TABLET | Freq: Four times a day (QID) | ORAL | 0 refills | Status: AC | PRN
Start: 2023-05-15 — End: ?

## 2023-05-15 MED ORDER — ONDANSETRON HCL 4 MG/2ML IJ SOLN
4.0000 mg | Freq: Once | INTRAMUSCULAR | Status: AC
  Administered 2023-05-15: 4 mg via INTRAVENOUS
  Filled 2023-05-15: qty 2

## 2023-05-15 MED ORDER — HYDROMORPHONE HCL 1 MG/ML IJ SOLN
1.0000 mg | Freq: Once | INTRAMUSCULAR | Status: AC
  Administered 2023-05-15: 1 mg via INTRAVENOUS
  Filled 2023-05-15: qty 1

## 2023-05-15 NOTE — Discharge Instructions (Signed)
Contact a health care provider if: You have a fever or chills. Your pee smells bad or looks cloudy. You have pain or burning when you pee. You have blood in your pee. Get help right away if: The pain in your flank or groin suddenly gets worse. You become confused or do not know the time of day, where you are, or who you are (become disoriented). You feel like you may faint or you faint.

## 2023-05-15 NOTE — ED Provider Notes (Signed)
[place] EMERGENCY DEPARTMENT AT [facility] Provider Note   CSN: [id number] Arrival date & time: 05/15/23  1423     History {Add pertinent medical, surgical, social history, OB history to HPI:1} Chief Complaint  Patient presents with   Flank Pain    Holly Mckinney is a 57 y.o. female who presents emergency department chief complaint of flank pain.  Patient had sudden onset of flank pain radiating into the right lower quadrant of her abdomen starting earlier this morning.  She states it went away but then came back and is now severe.  She has sharp pain radiating into her lower right quadrant, nausea, vomiting.  History of kidney stone.  Cyst states that this feels the same.  Pain is currently 10 out of 10  The history is provided by the patient.  Flank Pain       Home Medications Prior to Admission medications   Medication Sig Start Date End Date Taking? Authorizing Provider  acetaminophen (TYLENOL) 500 MG tablet Take 500 mg by mouth every 6 (six) hours as needed.    [provider]  acyclovir (ZOVIRAX) 200 MG capsule TAKE ONE CAPSULE BY MOUTH 5 TIMES DAILY FOR 10 DAYS 04/16/16   [provider]  linaclotide Karlene Einstein) 290 MCG CAPS capsule Take 1 capsule (290 mcg total) by mouth daily before breakfast. 05/13/17   Gordy Savers, MD  meloxicam (MOBIC) 15 MG tablet TAKE 1 TABLET BY MOUTH DAILY 04/06/18   Gordy Savers, MD  ondansetron (ZOFRAN-ODT) 4 MG disintegrating tablet Take 1 tablet (4 mg total) by mouth every 8 (eight) hours as needed. 12/03/21   Long, Arlyss Repress, MD  oxyCODONE-acetaminophen (PERCOCET/ROXICET) 5-325 MG tablet Take 1 tablet by mouth every 6 (six) hours as needed for severe pain. 12/03/21   Long, Arlyss Repress, MD  polyethylene glycol powder (GLYCOLAX/MIRALAX) powder Take 17 g by mouth 2 (two) times daily as needed. 05/13/17   Gordy Savers, MD  senna-docusate (SENOKOT-S) 8.6-50 MG tablet Take 1 tablet by mouth at bedtime as  needed for mild constipation or moderate constipation. 12/03/21   Long, Arlyss Repress, MD  tamsulosin (FLOMAX) 0.4 MG CAPS capsule Take 1 capsule (0.4 mg total) by mouth daily. 12/03/21   Long, Arlyss Repress, MD      Allergies    Patient has no known allergies.    Review of Systems   Review of Systems  Genitourinary:  Positive for flank pain.    Physical Exam Updated Vital Signs BP (!) 154/67 (BP Location: Right Arm)   Pulse 64   Temp 97.6 F (36.4 C) (Oral)   Resp 16   Ht 5\' 3"  (1.6 m)   Wt 72.6 kg   LMP 11/09/2012 (Exact Date)   SpO2 100%   BMI 28.34 kg/m  Physical Exam Vitals and nursing note reviewed.  Constitutional:      General: She is not in acute distress.    Appearance: She is well-developed. She is not diaphoretic.     Comments: Patient appears to be in severe pain  HENT:     Head: Normocephalic and atraumatic.     Right Ear: External ear normal.     Left Ear: External ear normal.     Nose: Nose normal.     Mouth/Throat:     Mouth: Mucous membranes are moist.  Eyes:     General: No scleral icterus.    Conjunctiva/sclera: Conjunctivae normal.  Cardiovascular:     Rate and Rhythm: Normal rate  and regular rhythm.     Heart sounds: Normal heart sounds. No murmur heard.    No friction rub. No gallop.  Pulmonary:     Effort: Pulmonary effort is normal. No respiratory distress.     Breath sounds: Normal breath sounds.  Abdominal:     General: Bowel sounds are normal. There is no distension.     Palpations: Abdomen is soft. There is no mass.     Tenderness: There is no abdominal tenderness. There is no right CVA tenderness, left CVA tenderness or guarding.  Musculoskeletal:     Cervical back: Normal range of motion.  Skin:    General: Skin is warm and dry.  Neurological:     Mental Status: She is alert and oriented to person, place, and time.  Psychiatric:        Behavior: Behavior normal.     ED Results / Procedures / Treatments   Labs (all labs ordered are  listed, but only abnormal results are displayed) Labs Reviewed  URINALYSIS, ROUTINE W REFLEX MICROSCOPIC - Abnormal; Notable for the following components:      Result Value   APPearance HAZY (*)    Hgb urine dipstick LARGE (*)    Ketones, ur TRACE (*)    Protein, ur TRACE (*)    All other components within normal limits  CBC - Abnormal; Notable for the following components:   WBC 11.9 (*)    All other components within normal limits  BASIC METABOLIC PANEL    EKG None  Radiology No results found.  Procedures Procedures  {Document cardiac monitor, telemetry assessment procedure when appropriate:1}  Medications Ordered in ED Medications  ondansetron (ZOFRAN) injection 4 mg (4 mg Intravenous Given 05/15/23 1521)  HYDROmorphone (DILAUDID) injection 1 mg (1 mg Intravenous Given 05/15/23 1521)    ED Course/ Medical Decision Making/ A&P   {   Click here for ABCD2, HEART and other calculatorsREFRESH Note before signing :1}                          Medical Decision Making Amount and/or Complexity of Data Reviewed Labs: ordered. Radiology: ordered.  Risk Prescription drug management.   ***  {Document critical care time when appropriate:1} {Document review of labs and clinical decision tools ie heart score, Chads2Vasc2 etc:1}  {Document your independent review of radiology images, and any outside records:1} {Document your discussion with family members, caretakers, and with consultants:1} {Document social determinants of health affecting pt's care:1} {Document your decision making why or why not admission, treatments were needed:1} Final Clinical Impression(s) / ED Diagnoses Final diagnoses:  None    Rx / DC Orders ED Discharge Orders     None

## 2023-05-15 NOTE — ED Triage Notes (Signed)
Pt c/o right sided flank pain that wraps to the right side of her abd. Pt tried taking zofran and norco at home, threw both up immediately after. Hx of stones.

## 2024-12-11 ENCOUNTER — Telehealth: Payer: Self-pay | Admitting: *Deleted
